# Patient Record
Sex: Female | Born: 1975 | Race: White | Hispanic: No | Marital: Married | Smoking: Current every day smoker
Health system: Southern US, Community
[De-identification: ages and names within clinical notes are randomized; demographics above are authoritative.]

## PROBLEM LIST (undated history)

## (undated) DIAGNOSIS — J189 Pneumonia, unspecified organism: Secondary | ICD-10-CM

## (undated) DIAGNOSIS — G43909 Migraine, unspecified, not intractable, without status migrainosus: Secondary | ICD-10-CM

## (undated) DIAGNOSIS — M1990 Unspecified osteoarthritis, unspecified site: Secondary | ICD-10-CM

## (undated) DIAGNOSIS — F319 Bipolar disorder, unspecified: Secondary | ICD-10-CM

## (undated) DIAGNOSIS — R51 Headache: Secondary | ICD-10-CM

## (undated) DIAGNOSIS — G8190 Hemiplegia, unspecified affecting unspecified side: Secondary | ICD-10-CM

## (undated) DIAGNOSIS — J449 Chronic obstructive pulmonary disease, unspecified: Secondary | ICD-10-CM

## (undated) DIAGNOSIS — R519 Headache, unspecified: Secondary | ICD-10-CM

## (undated) DIAGNOSIS — I639 Cerebral infarction, unspecified: Secondary | ICD-10-CM

## (undated) DIAGNOSIS — T148XXA Other injury of unspecified body region, initial encounter: Secondary | ICD-10-CM

## (undated) DIAGNOSIS — G911 Obstructive hydrocephalus: Secondary | ICD-10-CM

## (undated) DIAGNOSIS — I10 Essential (primary) hypertension: Secondary | ICD-10-CM

## (undated) DIAGNOSIS — R7303 Prediabetes: Secondary | ICD-10-CM

## (undated) DIAGNOSIS — R569 Unspecified convulsions: Secondary | ICD-10-CM

## (undated) HISTORY — PX: BRAIN SURGERY: SHX531

## (undated) HISTORY — PX: CHOLECYSTECTOMY: SHX55

## (undated) HISTORY — PX: ABDOMINAL HYSTERECTOMY: SHX81

## (undated) HISTORY — PX: KNEE SURGERY: SHX244

## (undated) HISTORY — PX: VENTRICULOPERITONEAL SHUNT: SHX204

---

## 1999-04-10 ENCOUNTER — Inpatient Hospital Stay (HOSPITAL_COMMUNITY): Admission: AD | Admit: 1999-04-10 | Discharge: 1999-04-14 | Payer: Self-pay | Admitting: Neurosurgery

## 1999-04-11 ENCOUNTER — Encounter: Payer: Self-pay | Admitting: Neurosurgery

## 1999-04-13 ENCOUNTER — Encounter: Payer: Self-pay | Admitting: Neurosurgery

## 1999-07-21 ENCOUNTER — Emergency Department (HOSPITAL_COMMUNITY): Admission: EM | Admit: 1999-07-21 | Discharge: 1999-07-21 | Payer: Self-pay | Admitting: Emergency Medicine

## 1999-12-13 ENCOUNTER — Emergency Department (HOSPITAL_COMMUNITY): Admission: EM | Admit: 1999-12-13 | Discharge: 1999-12-13 | Payer: Self-pay | Admitting: Emergency Medicine

## 1999-12-14 ENCOUNTER — Encounter: Payer: Self-pay | Admitting: Emergency Medicine

## 2000-03-25 ENCOUNTER — Emergency Department (HOSPITAL_COMMUNITY): Admission: EM | Admit: 2000-03-25 | Discharge: 2000-03-25 | Payer: Self-pay | Admitting: *Deleted

## 2000-08-02 ENCOUNTER — Emergency Department (HOSPITAL_COMMUNITY): Admission: EM | Admit: 2000-08-02 | Discharge: 2000-08-02 | Payer: Self-pay | Admitting: *Deleted

## 2001-07-05 ENCOUNTER — Emergency Department (HOSPITAL_COMMUNITY): Admission: EM | Admit: 2001-07-05 | Discharge: 2001-07-05 | Payer: Self-pay | Admitting: Internal Medicine

## 2001-10-07 ENCOUNTER — Encounter: Payer: Self-pay | Admitting: Obstetrics and Gynecology

## 2001-10-07 ENCOUNTER — Ambulatory Visit (HOSPITAL_COMMUNITY): Admission: RE | Admit: 2001-10-07 | Discharge: 2001-10-07 | Payer: Self-pay | Admitting: Obstetrics and Gynecology

## 2001-11-16 ENCOUNTER — Observation Stay (HOSPITAL_COMMUNITY): Admission: RE | Admit: 2001-11-16 | Discharge: 2001-11-17 | Payer: Self-pay | Admitting: Obstetrics and Gynecology

## 2002-01-11 ENCOUNTER — Emergency Department (HOSPITAL_COMMUNITY): Admission: EM | Admit: 2002-01-11 | Discharge: 2002-01-11 | Payer: Self-pay | Admitting: Emergency Medicine

## 2002-02-12 ENCOUNTER — Ambulatory Visit (HOSPITAL_COMMUNITY): Admission: RE | Admit: 2002-02-12 | Discharge: 2002-02-12 | Payer: Self-pay | Admitting: Internal Medicine

## 2002-03-05 ENCOUNTER — Ambulatory Visit (HOSPITAL_COMMUNITY): Admission: RE | Admit: 2002-03-05 | Discharge: 2002-03-05 | Payer: Self-pay | Admitting: Family Medicine

## 2002-03-05 ENCOUNTER — Encounter: Payer: Self-pay | Admitting: Family Medicine

## 2003-06-07 ENCOUNTER — Emergency Department (HOSPITAL_COMMUNITY): Admission: EM | Admit: 2003-06-07 | Discharge: 2003-06-07 | Payer: Self-pay | Admitting: Emergency Medicine

## 2003-06-07 ENCOUNTER — Encounter: Payer: Self-pay | Admitting: Emergency Medicine

## 2003-10-04 ENCOUNTER — Ambulatory Visit (HOSPITAL_COMMUNITY): Admission: RE | Admit: 2003-10-04 | Discharge: 2003-10-04 | Payer: Self-pay | Admitting: Orthopaedic Surgery

## 2003-10-04 ENCOUNTER — Encounter: Payer: Self-pay | Admitting: Orthopaedic Surgery

## 2005-02-22 ENCOUNTER — Encounter: Admission: RE | Admit: 2005-02-22 | Discharge: 2005-02-22 | Payer: Self-pay

## 2005-09-16 ENCOUNTER — Encounter: Admission: RE | Admit: 2005-09-16 | Discharge: 2005-09-23 | Payer: Self-pay | Admitting: *Deleted

## 2006-04-30 ENCOUNTER — Encounter: Payer: Self-pay | Admitting: Obstetrics and Gynecology

## 2006-10-13 ENCOUNTER — Encounter
Admission: RE | Admit: 2006-10-13 | Discharge: 2006-12-12 | Payer: Self-pay | Admitting: Physical Medicine and Rehabilitation

## 2007-09-01 ENCOUNTER — Emergency Department (HOSPITAL_COMMUNITY): Admission: EM | Admit: 2007-09-01 | Discharge: 2007-09-01 | Payer: Self-pay | Admitting: Emergency Medicine

## 2009-03-15 ENCOUNTER — Encounter
Admission: RE | Admit: 2009-03-15 | Discharge: 2009-05-11 | Payer: Self-pay | Admitting: Physical Medicine and Rehabilitation

## 2010-07-02 ENCOUNTER — Observation Stay (HOSPITAL_COMMUNITY): Admission: EM | Admit: 2010-07-02 | Discharge: 2010-07-04 | Payer: Self-pay | Admitting: Emergency Medicine

## 2011-01-20 ENCOUNTER — Encounter: Payer: Self-pay | Admitting: Internal Medicine

## 2011-02-13 ENCOUNTER — Ambulatory Visit: Payer: Medicare Other | Attending: Physician Assistant | Admitting: Physical Therapy

## 2011-02-13 DIAGNOSIS — M542 Cervicalgia: Secondary | ICD-10-CM | POA: Insufficient documentation

## 2011-02-13 DIAGNOSIS — R5381 Other malaise: Secondary | ICD-10-CM | POA: Insufficient documentation

## 2011-02-13 DIAGNOSIS — IMO0001 Reserved for inherently not codable concepts without codable children: Secondary | ICD-10-CM | POA: Insufficient documentation

## 2011-02-13 DIAGNOSIS — R293 Abnormal posture: Secondary | ICD-10-CM | POA: Insufficient documentation

## 2011-03-17 LAB — POCT CARDIAC MARKERS
CKMB, poc: <1 ng/mL — ABNORMAL LOW (ref 1.0–8.0)
Myoglobin, poc: 44 ng/mL (ref 12–200)
Troponin i, poc: <0.05 ng/mL (ref 0.00–0.09)

## 2011-03-17 LAB — DIFFERENTIAL
Eosinophils Absolute: 0.1 10*3/uL (ref 0.0–0.7)
Eosinophils Absolute: 0.1 10*3/uL (ref 0.0–0.7)
Eosinophils Relative: 2 % (ref 0–5)
Lymphocytes Relative: 28 % (ref 12–46)
Lymphocytes Relative: 44 % (ref 12–46)
Monocytes Absolute: 0.3 10*3/uL (ref 0.1–1.0)
Monocytes Relative: 4 % (ref 3–12)
Monocytes Relative: 5 % (ref 3–12)
Neutro Abs: 3 10*3/uL (ref 1.7–7.7)
Neutrophils Relative %: 49 % (ref 43–77)
Neutrophils Relative %: 67 % (ref 43–77)

## 2011-03-17 LAB — URINALYSIS, ROUTINE W REFLEX MICROSCOPIC
Glucose, UA: NEGATIVE mg/dL
Ketones, ur: NEGATIVE mg/dL
Protein, ur: NEGATIVE mg/dL
Specific Gravity, Urine: 1.01 (ref 1.005–1.030)
Urobilinogen, UA: 0.2 mg/dL (ref 0.0–1.0)

## 2011-03-17 LAB — CBC
HCT: 35.4 % — ABNORMAL LOW (ref 36.0–46.0)
Hemoglobin: 12.3 g/dL (ref 12.0–15.0)
MCH: 32 pg (ref 26.0–34.0)
MCV: 92.1 fL (ref 78.0–100.0)
RBC: 3.29 MIL/uL — ABNORMAL LOW (ref 3.87–5.11)
RDW: 13.7 % (ref 11.5–15.5)
RDW: 13.9 % (ref 11.5–15.5)
WBC: 6.2 10*3/uL (ref 4.0–10.5)
WBC: 8.1 10*3/uL (ref 4.0–10.5)

## 2011-03-17 LAB — COMPREHENSIVE METABOLIC PANEL
BUN: 9 mg/dL (ref 6–23)
GFR calc Af Amer: >60 mL/min (ref >60)
GFR calc non Af Amer: >60 mL/min (ref >60)
Sodium: 141 mEq/L (ref 135–145)
Total Protein: 5 g/dL — ABNORMAL LOW (ref 6.0–8.3)

## 2011-03-17 LAB — BASIC METABOLIC PANEL
CO2: 22 mEq/L (ref 19–32)
GFR calc Af Amer: >60 mL/min (ref >60)
GFR calc non Af Amer: >60 mL/min (ref >60)
Glucose, Bld: 112 mg/dL — ABNORMAL HIGH (ref 70–99)

## 2011-03-17 LAB — VITAMIN B12: Vitamin B-12: 365 pg/mL (ref 211–911)

## 2011-03-25 ENCOUNTER — Ambulatory Visit: Payer: Medicare Other | Admitting: Physical Therapy

## 2011-03-27 ENCOUNTER — Ambulatory Visit: Payer: Medicare Other | Admitting: Physical Therapy

## 2011-04-01 ENCOUNTER — Encounter: Payer: Medicare Other | Admitting: Physical Therapy

## 2011-05-17 NOTE — H&P (Signed)
[facility]  Patient:    Kelli Miranda, Kelli Miranda Visit Number: [id number] MRN: [id number]          Service Type: OBV Location: 4A A427 01 Attending Physician:  Tilda Burrow Dictated by:   Christin Bach, M.D. Admit Date:  11/16/2001 Discharge Date: 11/17/2001                           History and Physical  ADMITTING DIAGNOSES: 1. Chronic pelvic pain. 2. Recurrent hemorrhagic cyst, left ovary. 3. Status post ventriculoperitoneal shunt for communicating hydrocephalus,    April 2000.  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  In January 1999, the patient underwent laparoscopic evaluation which identified right lower quadrant adhesions from the ileum to the lower abdomen wall.  There was a normal-appearing ovary on the right that was bound down to the right sidewall.  The patient had a relatively normal-appearing left ovary at the time of that surgery, and the decision was made to remove the right tube and ovary and leave the left ovary.  Unfortunately, the patient developed postoperative hematoma attributable to excess activity within two hours of her surgery which induced severe vomiting (the patient went down to smoke less than one hour postsurgery) and then subsequently developed a hematoma which required laparoscopic evacuation on January 14, 1998, two days postoperatively.  The patient then went on and did well surgically, but developed progressive left lower quadrant pain beginning in 2001 which has been evaluated by ultrasound showing intermittent cysts on the left ovary which seems to be related to the pain.  Most recent ultrasound October 2002, showed a 3.5 x 3.6 x 2.8 cm hemorrhage within the left ovary.   The patient remained persistently tender in the left adnexa with a sense of fullness bilaterally, but primarily pain on the left.  The patient is being admitted at this time for laparoscopic removal of left tube and ovary.  She is aware that it may be necessary to perform an open procedure given that she might have additional adhesions internally.  The patient has had a bowel prep overnight to reduce abdominal contents for surgery.  PAST MEDICAL HISTORY: 1. Also significant for severe headache and neurologic abnormalities noted in    early 2000 resulting in the diagnosis of communicating hydrocephalus,    resulting in ventriculoperitoneal shunt placed April 2000. 2. Additionally, the patient has asthma and has anxiety disorder.  PAST SURGICAL HISTORY: 1. Cholecystectomy October 1997. 2. Vaginal hysterectomy June 1998. 3. Laparoscopic right salpingo-oophorectomy in January 1999. 4. Evacuation of pelvic hematoma in January 1999. 6. Ventriculoperitoneal shunt for communicating hydrocephalus April 2000.  MEDICATIONS: 1. Xanax 0.5 mg p.o. q.d. 2. Ambien 10 mg q.h.s. as needed for sleep. 3. Depakote. 4. Imitrex. 5. Elavil. 6. Voltaren taken for pain.  PHYSICAL EXAMINATION:  GENERAL:  Reveals a somber Caucasian female with a somewhat slow speech pattern since status post her hydrocephalus symptoms and subsequent surgery.  NEUROLOGIC:  The patient has cranial nerves 2-12 intact.  Pupils are equal, round and reactive.  NECK:  Trachea is midline.  CHEST:  Clear to auscultation.  ABDOMEN:  Nontender with well-healed surgical sites in the past from past laparoscopic procedure.  GENITOURINARY:  External genitalia normal female.  Vaginal exam normal, uterus and cervix absent with 2+ tender adnexa, slight fullness noted.  RECTAL:  No current bleeding per rectum; the patient has had hemorrhoids in the past and these are currently resolved.  ASSESSMENT:  Chronic pelvic pain.  I  suspect adhesions and hemorrhagic left ovarian cyst.  PLAN:  Laparoscopic left salpingo-oophorectomy and lysis of adhesions, possible open procedure on November 16, 2001. Dictated by:   Christin Bach, M.D. Attending Physician:  Tilda Burrow DD:  11/15/01 TD:  11/16/01 Job: [id number]

## 2011-05-17 NOTE — Op Note (Signed)
[facility]  Patient:    Kelli Miranda, Kelli Miranda Visit Number: [id number] MRN: [id number]          Service Type: DSU Location: DAY Attending Physician:  Tilda Burrow Dictated by:   Christin Bach, M.D. Proc. Date: 11/16/01 Admit Date:  11/16/2001                             Operative Report  PREOPERATIVE DIAGNOSES:  Left lower quadrant pain; ovarian enlargement, suspected ovarian cyst.  POSTOPERATIVE DIAGNOSES:  Left lower quadrant pain; ovarian enlargement, suspected ovarian cyst -- functional left ovarian cyst; extensive adhesions from sigmoid to left adnexa and anterior abdominal wall.  PROCEDURE:  SURGEON:  Christin Bach, M.D.  ASSISTANT:  Estelle June, C.N.M.  ANESTHESIA:  General, ______ , C.R.N.A.  COMPLICATIONS:  None.  FINDINGS:  Adhesions from epiploic fat to anterior abdominal wall to the left pelvic sidewall overlying left tube and ovary.  Scanning the abdomen also revealed that there were thin filmy adhesions around the tip of the ventriculoperitoneal shunt which was in the right upper quadrant, extensive adhesions from the ascending colon to the right pelvic sidewall, asymptomatic and left alone.  DETAILS OF PROCEDURE:  Patient was taken to the operating room, prepped and draped for combined abdominal and vaginal procedure, with a Foley catheter in place, and the vaginal packing placed on ring forceps to allow orientation to the vaginal apex.  Infraumbilical vertical skin incision was made as well as a suprapubic transverse incision, with an 11-mm disposable port placed through the umbilicus after pneumoperitoneum achieved with a Veress needle, which had its position confirmed with the water test.  The abdomen was devoid of adhesions over the anterior abdominal wall but did have some previously mentioned sigmoid colon adhesions to the anterior aspects of the pelvis and, of course, the left adnexa was obscured behind  sigmoid adhesions.  A couple of the sigmoid adhesions were quite tense and appeared to have the potential to be sources of discomfort.  These were all dissected free and the sigmoid mobilized away from the overlying left adnexa.  The left tube and ovary were then put on traction, the round ligament identified, taken down, the retroperitoneum entered on the side and the tube and ovary placed on traction so that the infundibulopelvic ligament could be isolated and cross-clamped without any risk to the ureter or pelvic sidewall structures.  Endo GIA was placed through a left lower quadrant 12-mm port, placed across the pedicle and fired with good hemostasis.  Ovary was removed through the left lower quadrant with an EndoCatch.  The pelvis was irrigated, suctioned out and confirmed as hemostatic, with point cautery used at a couple of spots where continued slight oozing persisted.  Scanning the upper abdomen revealed the previously mentioned thin filmy adhesions from the liver.  There were some very dense adhesions from the left side of the liver to the round ligament.  These were visualized and photo-documented and left alone.  Patient tolerated procedure well, had deflation of the abdomen, removal of CO2 without any instilling of saline into the abdomen, and the fascial openings were closed at the umbilicus and the left lower quadrant.  Subcuticular 4-0 Dexon closure of all three incisions was performed.  Patient tolerated procedure well and went to recovery room in good condition. Dictated by:   Christin Bach, M.D. Attending Physician:  Tilda Burrow DD:  11/16/01 TD:  11/16/01 Job: [id number]

## 2011-05-17 NOTE — Op Note (Signed)
[facility]  Patient:    Kelli Miranda, Kelli Miranda Visit Number: [id number] MRN: [id number]          Service Type: END Location: DAY Attending Physician:  Jonathon Bellows Dictated by:   Roetta Sessions, M.D. Proc. Date: 02/12/02 Admit Date:  02/12/2002 Discharge Date: 02/12/2002   CC:         Rockne Coons., M.D.   Operative Report  PROCEDURE:  Diagnostic esophagogastroduodenoscopy followed by colonoscopy, ileoscopy.  INDICATIONS FOR PROCEDURE:  The patient is a 35 year old lady with abdominal pain, intermittent nausea and vomiting, hematemesis, and hematochezia who is undergoing colonoscopy and EGD.  This approach has been discussed with Ms. Kelli Miranda at length previously in the office and again today at the bedside. Potential risks, benefits, and alternatives have been reviewed.  She was slated to have these examinations last week but because of the terminal illness of her grandmother, she put off the studies and also has not really started taking the NuLev or the Nexium that was prescribed in our office.  She has given written consent for me to carry these procedures out today.  They have been discussed with her at length.  I believe she is at low risk for conscious sedation with Versed and Demerol.  DESCRIPTION OF PROCEDURE:  Oxygen saturation, blood pressure, pulse and respirations were monitored throughout the entirety of both procedures.  CONSCIOUS SEDATION:  Versed 6 mg IV, Demerol 100 mg IV in divided doses, ampicillin 2 g IV and gentamicin 80 mg IV for prophylaxis given her VP shunt.  INSTRUMENT:  Olympus videochip gastroscope and colonoscope.  EGD FINDINGS:  Examination of the tubular esophagus revealed no mucosal abnormalities.  The EG junction was easily traversed.  STOMACH:  Gastric cavity was empty and insufflated well with air.  A thorough examination of the gastric mucosa including retroflexed view of the proximal stomach and  esophagogastric junction demonstrated no abnormalities.   Pylorus was patent and easily traversed.  DUODENUM:  The bulb and second portion appeared normal.  THERAPEUTIC/DIAGNOSTIC MANEUVERS PERFORMED:  None.  The patient tolerated the procedure very well and was prepared for colonoscopy.  COLONOSCOPIC FINDINGS:  Digital rectal examination revealed no abnormalities.  ENDOSCOPIC FINDINGS:  Prep was adequate.  RECTUM:  Examination of the rectal mucosa including retroflexed view  of the anal verge including _____ and anal canal revealed minimal anal canal hemorrhoids.  Rectum appeared normal.  COLON:  The colonic mucosa was surveyed from the rectosigmoid junction through the left, transverse and right colon to the area of the appendiceal orifice, ileocecal valve and cecum.  These structures were well seen and photographed. The colonic mucosa all the way to the cecum appeared normal.  The terminal ileum was intubated to 15 cm.  This segment of the GI tract also appeared normal.  From the level, the scope was slowly withdrawn and all previously mentioned mucosal surfaces were again seen.  Again, no other abnormalities were observed.  The patient tolerated the procedures well and was reacted in endoscopy.  IMPRESSION: 1. Esophagogastroduodenoscopy normal. 2. Colonoscopic findings:  Minimal anal canal hemorrhoids, otherwise normal    rectum, normal colon, normal terminal ileum.  RECOMMENDATIONS:  I told Ms. Kelli Miranda she should start taking Nexium 40 mg early daily, at least 30 minutes before breakfast and start taking the NuLev one 30 minutes orally before meals.  There is no endoscopic explanation for any of the patients symptoms on todays studies.  We will see how she responds to empiric  treatment.  I have asked Ms. Kelli Miranda to return to see [place] in three to four weeks so we can assess her progress and go from there.  RECOMMENDATIONS:    IMPRESSION: 3. The remainder of the upper  gastrointestinal tract appeared normal.  RECOMMENDATIONS: Dictated by:   Roetta Sessions, M.D. Attending Physician:  Jonathon Bellows DD:  02/12/02 TD:  02/12/02 Job: [id number]

## 2011-05-17 NOTE — Op Note (Signed)
[facility]  Patient:    Kelli Miranda, Kelli Miranda Visit Number: [id number] MRN: [id number]          Service Type: END Location: DAY Attending Physician:  Jonathon Bellows Dictated by:   Roetta Sessions, M.D. Proc. Date: 02/12/02 Admit Date:  02/12/2002 Discharge Date: 02/12/2002   CC:         Rockne Coons., M.D.   Operative Report  ADDENDUM:  RECOMMENDATIONS: No endoscopic explanation for the patients symptoms based on the examination today although she could have relatively trivial bleeding from anal canal hemorrhoids.  She is to use her Nexium and NuLev as previously prescribed.  I will also give her a course of Anusol HC suppositories one per rectum at bedtime.  Will have her come back to see [place] in three to four weeks so we can assess her progress. Dictated by:   Roetta Sessions, M.D. Attending Physician:  Jonathon Bellows DD:  02/12/02 TD:  02/12/02 Job: [id number]

## 2012-01-07 ENCOUNTER — Ambulatory Visit: Payer: Medicare Other | Attending: Orthopaedic Surgery | Admitting: Physical Therapy

## 2012-01-07 DIAGNOSIS — M25569 Pain in unspecified knee: Secondary | ICD-10-CM | POA: Insufficient documentation

## 2012-01-07 DIAGNOSIS — R5381 Other malaise: Secondary | ICD-10-CM | POA: Insufficient documentation

## 2012-01-07 DIAGNOSIS — IMO0001 Reserved for inherently not codable concepts without codable children: Secondary | ICD-10-CM | POA: Insufficient documentation

## 2012-01-09 ENCOUNTER — Ambulatory Visit: Payer: Medicare Other | Admitting: Physical Therapy

## 2012-01-16 ENCOUNTER — Encounter: Payer: Medicare Other | Admitting: Physical Therapy

## 2012-01-17 ENCOUNTER — Encounter: Payer: Medicare Other | Admitting: *Deleted

## 2012-01-20 ENCOUNTER — Encounter: Payer: Medicare Other | Admitting: Physical Therapy

## 2012-01-28 ENCOUNTER — Encounter: Payer: Medicare Other | Admitting: Physical Therapy

## 2012-01-29 ENCOUNTER — Encounter: Payer: Medicare Other | Admitting: Physical Therapy

## 2012-01-30 ENCOUNTER — Encounter: Payer: Medicare Other | Admitting: Physical Therapy

## 2012-02-27 ENCOUNTER — Ambulatory Visit: Payer: Medicare Other | Admitting: Physical Therapy

## 2015-10-25 ENCOUNTER — Inpatient Hospital Stay (HOSPITAL_COMMUNITY): Payer: Medicare Other

## 2015-10-25 ENCOUNTER — Encounter (HOSPITAL_COMMUNITY): Payer: Self-pay | Admitting: Neurology

## 2015-10-25 ENCOUNTER — Inpatient Hospital Stay (HOSPITAL_COMMUNITY)
Admission: EM | Admit: 2015-10-25 | Discharge: 2015-10-27 | DRG: 056 | Disposition: A | Discharge status: Home Health | Payer: Medicare Other | Attending: Internal Medicine | Admitting: Internal Medicine

## 2015-10-25 ENCOUNTER — Emergency Department (HOSPITAL_COMMUNITY): Payer: Medicare Other

## 2015-10-25 DIAGNOSIS — I10 Essential (primary) hypertension: Secondary | ICD-10-CM | POA: Diagnosis present

## 2015-10-25 DIAGNOSIS — I6203 Nontraumatic chronic subdural hemorrhage: Secondary | ICD-10-CM | POA: Diagnosis present

## 2015-10-25 DIAGNOSIS — R739 Hyperglycemia, unspecified: Secondary | ICD-10-CM | POA: Diagnosis not present

## 2015-10-25 DIAGNOSIS — L03211 Cellulitis of face: Secondary | ICD-10-CM | POA: Diagnosis present

## 2015-10-25 DIAGNOSIS — R208 Other disturbances of skin sensation: Secondary | ICD-10-CM | POA: Diagnosis not present

## 2015-10-25 DIAGNOSIS — R209 Unspecified disturbances of skin sensation: Secondary | ICD-10-CM

## 2015-10-25 DIAGNOSIS — I69354 Hemiplegia and hemiparesis following cerebral infarction affecting left non-dominant side: Secondary | ICD-10-CM | POA: Diagnosis not present

## 2015-10-25 DIAGNOSIS — E1165 Type 2 diabetes mellitus with hyperglycemia: Secondary | ICD-10-CM | POA: Diagnosis present

## 2015-10-25 DIAGNOSIS — Z982 Presence of cerebrospinal fluid drainage device: Secondary | ICD-10-CM | POA: Diagnosis not present

## 2015-10-25 DIAGNOSIS — R531 Weakness: Secondary | ICD-10-CM | POA: Diagnosis present

## 2015-10-25 DIAGNOSIS — K111 Hypertrophy of salivary gland: Secondary | ICD-10-CM | POA: Diagnosis present

## 2015-10-25 DIAGNOSIS — R29898 Other symptoms and signs involving the musculoskeletal system: Secondary | ICD-10-CM | POA: Diagnosis present

## 2015-10-25 DIAGNOSIS — K1120 Sialoadenitis, unspecified: Secondary | ICD-10-CM | POA: Diagnosis present

## 2015-10-25 DIAGNOSIS — F17200 Nicotine dependence, unspecified, uncomplicated: Secondary | ICD-10-CM | POA: Diagnosis present

## 2015-10-25 DIAGNOSIS — E876 Hypokalemia: Secondary | ICD-10-CM | POA: Diagnosis present

## 2015-10-25 DIAGNOSIS — R200 Anesthesia of skin: Secondary | ICD-10-CM

## 2015-10-25 DIAGNOSIS — J449 Chronic obstructive pulmonary disease, unspecified: Secondary | ICD-10-CM | POA: Diagnosis present

## 2015-10-25 DIAGNOSIS — I6789 Other cerebrovascular disease: Secondary | ICD-10-CM | POA: Diagnosis not present

## 2015-10-25 DIAGNOSIS — M6289 Other specified disorders of muscle: Secondary | ICD-10-CM | POA: Diagnosis not present

## 2015-10-25 DIAGNOSIS — I639 Cerebral infarction, unspecified: Secondary | ICD-10-CM | POA: Diagnosis not present

## 2015-10-25 DIAGNOSIS — F419 Anxiety disorder, unspecified: Secondary | ICD-10-CM | POA: Diagnosis present

## 2015-10-25 DIAGNOSIS — F329 Major depressive disorder, single episode, unspecified: Secondary | ICD-10-CM | POA: Diagnosis present

## 2015-10-25 HISTORY — DX: Essential (primary) hypertension: I10

## 2015-10-25 HISTORY — DX: Chronic obstructive pulmonary disease, unspecified: J44.9

## 2015-10-25 HISTORY — DX: Cerebral infarction, unspecified: I63.9

## 2015-10-25 LAB — CBC WITH DIFFERENTIAL/PLATELET
BASOS ABS: 0 10*3/uL (ref 0.0–0.1)
BASOS PCT: 0 %
Eosinophils Absolute: 0.1 10*3/uL (ref 0.0–0.7)
Eosinophils Relative: 1 %
HEMATOCRIT: 37.5 % (ref 36.0–46.0)
HEMOGLOBIN: 12.3 g/dL (ref 12.0–15.0)
LYMPHS PCT: 14 %
Lymphs Abs: 1.4 10*3/uL (ref 0.7–4.0)
MCH: 30.8 pg (ref 26.0–34.0)
MCHC: 32.8 g/dL (ref 30.0–36.0)
MCV: 93.8 fL (ref 78.0–100.0)
Monocytes Absolute: 0.6 10*3/uL (ref 0.1–1.0)
Monocytes Relative: 6 %
NEUTROS ABS: 7.8 10*3/uL — AB (ref 1.7–7.7)
NEUTROS PCT: 79 %
Platelets: 172 10*3/uL (ref 150–400)
RBC: 4 MIL/uL (ref 3.87–5.11)
RDW: 14.3 % (ref 11.5–15.5)
WBC: 9.9 10*3/uL (ref 4.0–10.5)

## 2015-10-25 LAB — BASIC METABOLIC PANEL
ANION GAP: 9 (ref 5–15)
BUN: 6 mg/dL (ref 6–20)
CALCIUM: 8.6 mg/dL — AB (ref 8.9–10.3)
CHLORIDE: 103 mmol/L (ref 101–111)
CO2: 23 mmol/L (ref 22–32)
Creatinine, Ser: 0.78 mg/dL (ref 0.44–1.00)
GFR calc non Af Amer: >60 mL/min (ref >60)
Glucose, Bld: 104 mg/dL — ABNORMAL HIGH (ref 65–99)
POTASSIUM: 3.3 mmol/L — AB (ref 3.5–5.1)
Sodium: 135 mmol/L (ref 135–145)

## 2015-10-25 MED ORDER — LORAZEPAM 2 MG/ML IJ SOLN
1.0000 mg | Freq: Once | INTRAMUSCULAR | Status: AC
  Administered 2015-10-25: 1 mg via INTRAVENOUS
  Filled 2015-10-25: qty 1

## 2015-10-25 MED ORDER — IOHEXOL 300 MG/ML  SOLN
75.0000 mL | Freq: Once | INTRAMUSCULAR | Status: AC | PRN
  Administered 2015-10-25: 75 mL via INTRAVENOUS

## 2015-10-25 NOTE — ED Notes (Signed)
Resident at bedside.  

## 2015-10-25 NOTE — H&P (Signed)
[facility]                                                                                    Patient Demographics  Kelli Miranda, is a 39 y.o. female  CSN: [id number]  MRN: [id number]  DOB - 1976/05/17  Admit Date - 10/25/2015  Outpatient Primary MD for the patient is PROVIDER NOT IN SYSTEM   With History of -  Past Medical History  Diagnosis Date  . Stroke (HCC)   . Diabetes mellitus without complication (HCC)   . Hypertension   . COPD (chronic obstructive pulmonary disease) [facility])       Past Surgical History  Procedure Laterality Date  . Knee surgery    . Ventriculoperitoneal shunt      in for   Chief Complaint  Patient presents with  . Facial Swelling     HPI  Kelli Miranda  is a 39 y.o. female, with past medical history significant for ventriculoperitoneal shunt and recent CVA around 4 months ago, sent from her PMDs office for evaluation of right facial swelling. The patient also reports new onset of right side weakness especially in the hand. Denies any history of dizziness or loss of consciousness. CT of the head in the emergency room was negative and MRI is pending. CT of the face showed mild right facial pre auricular soft tissue swelling most compatible with cellulitis without drainable fluid collection with mild inflammation of the parotid gland which is likely reactive.    Review of Systems    In addition to the HPI above,  No Fever-chills, No Headache, No changes with Vision or hearing, No problems swallowing food or Liquids, No Chest pain, Cough or Shortness of Breath, No Abdominal pain, No Nausea or Vommitting, Bowel movements are regular, No Blood in stool or Urine, No dysuria, No new skin rashes or bruises, No new joints pains-aches,  Right-sided hand weakness new weakness, pain in the feet which looks nonspecific, No recent weight gain or loss, No polyuria, polydypsia or polyphagia, No significant Mental  Stressors.  A full 10 point Review of Systems was done, except as stated above, all other Review of Systems were negative.   Social History Social History  Substance Use Topics  . Smoking status: Current Every Day Smoker  . Smokeless tobacco: Not on file  . Alcohol Use: No     Family History No family history on file.   Prior to Admission medications   Medication Sig Start Date End Date Taking? Authorizing Provider  ALPRAZolam Prudy Feeler) 0.5 MG tablet Take 0.5 mg by mouth at bedtime as needed for anxiety.   Yes Historical Provider, MD  diclofenac sodium (VOLTAREN) 1 % GEL Apply 2 g topically daily as needed. For knee pain   Yes Historical Provider, MD  FLUoxetine (PROZAC) 20 MG tablet Take 60 mg by mouth daily.   Yes Historical Provider, MD  Fluticasone-Salmeterol (ADVAIR) 250-50 MCG/DOSE AEPB Inhale 1 puff into the lungs 2 (two) times daily as needed. For shortness of breath   Yes Historical Provider, MD  gabapentin (NEURONTIN) 300 MG capsule Take 300 mg by mouth 3 (three) times daily.   Yes Historical  Provider, MD  HYDROcodone-acetaminophen (NORCO) 10-325 MG tablet Take 1 tablet by mouth every 6 (six) hours as needed for moderate pain.   Yes Historical Provider, MD  levETIRAcetam (KEPPRA) 250 MG tablet Take 250 mg by mouth 2 (two) times daily.   Yes Historical Provider, MD  ondansetron (ZOFRAN-ODT) 4 MG disintegrating tablet Take 4 mg by mouth 2 (two) times daily. Take every day per patient and family members   Yes Historical Provider, MD  pregabalin (LYRICA) 50 MG capsule Take 150 mg by mouth 3 (three) times daily.   Yes Historical Provider, MD  promethazine (PHENERGAN) 25 MG tablet Take 25 mg by mouth every 6 (six) hours as needed for nausea or vomiting.   Yes Historical Provider, MD  QUEtiapine (SEROQUEL) 100 MG tablet Take 100 mg by mouth at bedtime.   Yes Historical Provider, MD  topiramate (TOPAMAX) 200 MG tablet Take 400 mg by mouth 2 (two) times daily.   Yes Historical Provider,  MD  traZODone (DESYREL) 100 MG tablet Take 300 mg by mouth at bedtime.   Yes Historical Provider, MD    Allergies  Allergen Reactions  . Imitrex [Sumatriptan]     Hives   . Latex     Hives   . Percocet [Oxycodone-Acetaminophen]     Face swelling     Physical Exam  Vitals  Blood pressure 109/78, pulse 88, temperature 98.2 F (36.8 C), temperature source Oral, resp. rate 16, SpO2 93 %.   1. General well-developed, well-nourished female in no significant acute distress  2. Flat affect and insight, Not Suicidal or Homicidal, Awake Alert, Oriented X 3.  3.  ALL C.Nerves Intact, Strength 4 over 5 left arm and lower extremities, right hand 3 over 5 Sensation intact all 4 extremities, Plantars absent.  4. Ears and Eyes appear Normal, Conjunctivae clear, PERRLA. Moist Oral Mucosa.  5. Supple Neck, No JVD, No cervical lymphadenopathy appriciated, No Carotid Bruits.  6. Symmetrical Chest wall movement, Good air movement bilaterally, CTAB.  7. RRR, No Gallops, Rubs or Murmurs, No Parasternal Heave.  8. Positive Bowel Sounds, Abdomen Soft, Non tender, No organomegaly appriciated,No rebound -guarding or rigidity.  9.  No Cyanosis, Normal Skin Turgor, No Skin Rash or Bruise.  10. Good muscle tone,  joints appear normal , no effusions, Normal ROM.  11. No Palpable Lymph Nodes in Neck or Axillae    Data Review  CBC  Recent Labs Lab 10/25/15 2015  WBC 9.9  HGB 12.3  HCT 37.5  PLT 172  MCV 93.8  MCH 30.8  MCHC 32.8  RDW 14.3  LYMPHSABS 1.4  MONOABS 0.6  EOSABS 0.1  BASOSABS 0.0   ------------------------------------------------------------------------------------------------------------------  Chemistries   Recent Labs Lab 10/25/15 2015  NA 135  K 3.3*  CL 103  CO2 23  GLUCOSE 104*  BUN 6  CREATININE 0.78  CALCIUM 8.6*   ------------------------------------------------------------------------------------------------------------------ CrCl cannot be  calculated (Unknown ideal weight.). ------------------------------------------------------------------------------------------------------------------ No results for input(s): TSH, T4TOTAL, T3FREE, THYROIDAB in the last 72 hours.  Invalid input(s): FREET3   Coagulation profile No results for input(s): INR, PROTIME in the last 168 hours. ------------------------------------------------------------------------------------------------------------------- No results for input(s): DDIMER in the last 72 hours. -------------------------------------------------------------------------------------------------------------------  Cardiac Enzymes No results for input(s): CKMB, TROPONINI, MYOGLOBIN in the last 168 hours.  Invalid input(s): CK ------------------------------------------------------------------------------------------------------------------ Invalid input(s): POCBNP   ---------------------------------------------------------------------------------------------------------------  Urinalysis    Component Value Date/Time   COLORURINE YELLOW 07/02/2010 1515   APPEARANCEUR CLEAR 07/02/2010 1515   LABSPEC 1.010 07/02/2010 1515  PHURINE 6.0 07/02/2010 1515   GLUCOSEU NEGATIVE 07/02/2010 1515   HGBUR NEGATIVE 07/02/2010 1515   BILIRUBINUR NEGATIVE 07/02/2010 1515   KETONESUR NEGATIVE 07/02/2010 1515   PROTEINUR NEGATIVE 07/02/2010 1515   UROBILINOGEN 0.2 07/02/2010 1515   NITRITE NEGATIVE 07/02/2010 1515   LEUKOCYTESUR SMALL* 07/02/2010 1515    ----------------------------------------------------------------------------------------------------------------   Imaging results:   Ct Head Wo Contrast  10/25/2015  CLINICAL DATA:  RIGHT-sided facial swelling, stroke-like symptoms with RIGHT upper extremity weakness for 2 days. History of diabetes, hypertension and stroke. EXAM: CT HEAD WITHOUT CONTRAST CT MAXILLOFACIAL WITH CONTRAST TECHNIQUE: Multidetector CT imaging of the head was  performed using the standard protocol without IV contrast. CT imaging of the maxillofacial structures was performed with intravenous contrast. Multiplanar CT image reconstructions were also generated. CONTRAST:  75mL OMNIPAQUE IOHEXOL 300 MG/ML  SOLN COMPARISON:  CT head June 29, 2015 MRI of the brain June 29, 2015 FINDINGS: CT HEAD FINDINGS No intraparenchymal hemorrhage, mass effect, midline shift or acute large vascular territory infarct. 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 Ventriculoperitoneal shunt via a RIGHT parietal burr hole, with distal tip terminating in LEFT frontal horn, unchanged. Mild to moderate ventriculomegaly, decreased from prior imaging, no CT findings of hydrocephalus. No abnormal extra-axial fluid collections. Basal cisterns are patent. No skull fracture. CT MAXILLOFACIAL FINDINGS RIGHT facial/ preauricular mild soft tissue swelling, reticulated fat with minimal inflammatory changes superficial lobe of the RIGHT parotid gland. No parotid masses, ductal dilatation or sialolith. No radiopaque foreign bodies. Ocular globes and orbital contents are normal. Paranasal sinuses and mastoid air cells are well aerated. RIGHT jugular bulb dehiscence. No destructive bony lesions. Included airway is patent. Ventriculoperitoneal shunt courses through RIGHT neck, visualized portion is contiguous. Possible cerebellar DVA use though, incompletely characterized. IMPRESSION: CT HEAD: No acute intracranial process. Slightly decreased ventriculomegaly, stable positioning of ventriculoperitoneal shunt. 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 CT MAXILLOFACIAL: Mild RIGHT facial/preauricular soft tissue swelling most compatible cellulitis without drainable fluid collection. Mild inflammation of the parotid gland is likely reactive, less likely parotiditis. Electronically Signed   By: Awilda Metroourtnay  Bloomer M.D.   On: 10/25/2015 22:35   Ct Maxillofacial W/cm  10/25/2015  CLINICAL DATA:  RIGHT-sided facial swelling,  stroke-like symptoms with RIGHT upper extremity weakness for 2 days. History of diabetes, hypertension and stroke. EXAM: CT HEAD WITHOUT CONTRAST CT MAXILLOFACIAL WITH CONTRAST TECHNIQUE: Multidetector CT imaging of the head was performed using the standard protocol without IV contrast. CT imaging of the maxillofacial structures was performed with intravenous contrast. Multiplanar CT image reconstructions were also generated. CONTRAST:  75mL OMNIPAQUE IOHEXOL 300 MG/ML  SOLN COMPARISON:  CT head June 29, 2015 MRI of the brain June 29, 2015 FINDINGS: CT HEAD FINDINGS No intraparenchymal hemorrhage, mass effect, midline shift or acute large vascular territory infarct. 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 Ventriculoperitoneal shunt via a RIGHT parietal burr hole, with distal tip terminating in LEFT frontal horn, unchanged. Mild to moderate ventriculomegaly, decreased from prior imaging, no CT findings of hydrocephalus. No abnormal extra-axial fluid collections. Basal cisterns are patent. No skull fracture. CT MAXILLOFACIAL FINDINGS RIGHT facial/ preauricular mild soft tissue swelling, reticulated fat with minimal inflammatory changes superficial lobe of the RIGHT parotid gland. No parotid masses, ductal dilatation or sialolith. No radiopaque foreign bodies. Ocular globes and orbital contents are normal. Paranasal sinuses and mastoid air cells are well aerated. RIGHT jugular bulb dehiscence. No destructive bony lesions. Included airway is patent. Ventriculoperitoneal shunt courses through RIGHT neck, visualized portion is contiguous. Possible cerebellar DVA  use though, incompletely characterized. IMPRESSION: CT HEAD: No acute intracranial process. Slightly decreased ventriculomegaly, stable positioning of ventriculoperitoneal shunt. 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 CT MAXILLOFACIAL: Mild RIGHT facial/preauricular soft tissue swelling most compatible cellulitis without drainable fluid collection. Mild  inflammation of the parotid gland is likely reactive, less likely parotiditis. Electronically Signed   By: Awilda Metro M.D.   On: 10/25/2015 22:35   Dg Chest Portable 1 View  10/25/2015  CLINICAL DATA:  Swelling on the right side of the face for 3 days. VP shunt placed in 2000. History of stroke and left-sided weakness. EXAM: PORTABLE CHEST 1 VIEW COMPARISON:  Chest radiograph 07/02/2010 FINDINGS: Prominent interstitial lung markings bilaterally with mild peribronchial thickening. Mild fullness in the perihilar regions. Heart size is within normal limits. The trachea is midline. There is a right VP shunt. IMPRESSION: Prominent interstitial lung markings with perihilar fullness. Findings are concerning for mild pulmonary edema. Electronically Signed   By: Richarda Overlie M.D.   On: 10/25/2015 21:28      Assessment & Plan   1. New right upper extremity weakness with history of CVA and left-sided weakness around 4 months ago     CT of the head negative     MRI of the head pending     Neurology consult pending  2. Right facial cellulitis     IV antibiotics      No drainable abscess  3. Hypertension  4. Diabetes mellitus   DVT Prophylaxis Lovenox  AM Labs Ordered, also please review Full Orders  Code Status full code  Disposition Plan: Home  Time spent in minutes : 42 minutes  Condition GUARDED   @

## 2015-10-25 NOTE — ED Notes (Signed)
Pt comes from home where she has had swelling to right side of her face for 3 days. Pt has VP shunt that was placed in 2000. She had a stroke 4 months ago and now has left sided weakness. Pt is alert, reports now has trouble to right side for 3 days.

## 2015-10-25 NOTE — ED Notes (Signed)
MD at bedside. 

## 2015-10-25 NOTE — ED Notes (Signed)
Pt was told she will be going for MRI and that Internal Med was consulted; Pt states she is not staying here tonight; RN advised pt to discuss with MD

## 2015-10-25 NOTE — ED Provider Notes (Signed)
Arrival Date & Time: 10/25/15 & 1849  History   Chief Complaint  Patient presents with  . Facial Swelling   HPI Kelli Miranda is a 39 y.o. female with a chief complaint of Facial Swelling  Patient presents from primary care office due to concerns for right-sided facial swelling. Patient has no dysphagia or abnormal breathing and is noted to be in no respiratory distress. Patient also endorses no abnormal swelling or abnormal sensation of masses or cuts or trauma inside mouth. Patient has very poor dentition and states she is a multiple pack per day smoker and has prior medical history significant for stroke partially 4 months ago. 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 Patient denies fevers chills or neck pain headache and states she is also no altered mental status. Patient states for the past 2 days she had abnormal weakness of right upper extremity with abnormal sensation. Patient states the symptoms resolved this morning and states "I think I had a stroke 2 nights ago." Patient's daughter and family members aren't room and state the patient is "very bad about taking care of herself and she will tell you that nothing is wrong always." When asked about prior stroke patient minutes to left upper and lower extremity weakness which is residual a new baseline from prior stroke.  Past Medical History  I reviewed & agree with nursing's documentation on PMHx, PSHx, SHx and FHx. Past Medical History  Diagnosis Date  . Stroke (HCC)   . Diabetes mellitus without complication (HCC)   . Hypertension   . COPD (chronic obstructive pulmonary disease) [facility])    Past Surgical History  Procedure Laterality Date  . Knee surgery    . Ventriculoperitoneal shunt     Social History   Social History  . Marital Status: Married    Spouse Name: N/A  . Number of Children: N/A  . Years of Education: N/A   Social  History Main Topics  . Smoking status: Current Every Day Smoker  . Smokeless tobacco: None  . Alcohol Use: No  . Drug Use: None  . Sexual Activity: Not Asked   Other Topics Concern  . None   Social History Narrative  . None   No family history on file.  Review of Systems  Complete ROS obtained and pertinent positive and negatives documented above in HPI. All other ROS negative.  Allergies  Imitrex; Latex; and Percocet  Home Medications   Prior to Admission medications   Medication Sig Start Date End Date Taking? Authorizing Provider  ALPRAZolam Prudy Feeler) 0.5 MG tablet Take 0.5 mg by mouth at bedtime as needed for anxiety.   Yes Historical Provider, MD  diclofenac sodium (VOLTAREN) 1 % GEL Apply 2 g topically daily as needed. For knee pain   Yes Historical Provider, MD  FLUoxetine (PROZAC) 20 MG tablet Take 60 mg by mouth daily.   Yes Historical Provider, MD  Fluticasone-Salmeterol (ADVAIR) 250-50 MCG/DOSE AEPB Inhale 1 puff into the lungs 2 (two) times daily as needed. For shortness of breath   Yes Historical Provider, MD  gabapentin (NEURONTIN) 300 MG capsule Take 300 mg by mouth 3 (three) times daily.   Yes Historical Provider, MD  HYDROcodone-acetaminophen (NORCO) 10-325 MG tablet Take 1 tablet by mouth every 6 (six) hours as needed for moderate pain.   Yes Historical Provider, MD  levETIRAcetam (KEPPRA) 250 MG tablet Take 250 mg by mouth 2 (two)  times daily.   Yes Historical Provider, MD  ondansetron (ZOFRAN-ODT) 4 MG disintegrating tablet Take 4 mg by mouth 2 (two) times daily. Take every day per patient and family members   Yes Historical Provider, MD  pregabalin (LYRICA) 50 MG capsule Take 150 mg by mouth 3 (three) times daily.   Yes Historical Provider, MD  promethazine (PHENERGAN) 25 MG tablet Take 25 mg by mouth every 6 (six) hours as needed for nausea or vomiting.   Yes Historical Provider, MD  QUEtiapine (SEROQUEL) 100 MG tablet Take 100 mg by mouth at bedtime.   Yes  Historical Provider, MD  topiramate (TOPAMAX) 200 MG tablet Take 400 mg by mouth 2 (two) times daily.   Yes Historical Provider, MD  traZODone (DESYREL) 100 MG tablet Take 300 mg by mouth at bedtime.   Yes Historical Provider, MD  aspirin EC 81 MG tablet Take 1 tablet (81 mg total) by mouth daily. 10/27/15   [name], MD  clindamycin (CLEOCIN) 300 MG capsule Take 1 capsule (300 mg total) by mouth 4 (four) times daily. 10/27/15   [name], MD  vitamin B-12 500 MCG tablet Take 1 tablet (500 mcg total) by mouth daily. 10/27/15   [name], MD   Physical Exam  BP 100/71 mmHg  Pulse 72  Temp(Src) 97.6 F (36.4 C) (Axillary)  Resp 16  Wt 169 lb 8.5 oz (76.9 kg)  SpO2 95% Physical Exam  Constitutional: She is oriented to person, place, and time. She appears well-developed and well-nourished.  Non-toxic appearance. She does not appear ill. No distress.  HENT:  Head: Atraumatic. Head is without right periorbital erythema and without left periorbital erythema.  Right Ear: Tympanic membrane and ear canal normal. No tenderness. No mastoid tenderness. Tympanic membrane is not injected and not erythematous. No middle ear effusion.  Left Ear: Tympanic membrane, external ear and ear canal normal. No tenderness. No mastoid tenderness. Tympanic membrane is not injected and not erythematous.  No middle ear effusion.  Mouth/Throat: Uvula is midline. She does not have dentures. No oral lesions. No trismus in the jaw. Abnormal dentition. No dental abscesses, uvula swelling or lacerations. No oropharyngeal exudate, posterior oropharyngeal edema, posterior oropharyngeal erythema or tonsillar abscesses.  Eyes: Pupils are equal, round, and reactive to light. No scleral icterus.  Neck: Normal range of motion and full passive range of motion without pain. Neck supple. No tracheal tenderness present. No rigidity. No tracheal deviation present. No Brudzinski's sign and no Kernig's sign noted. No thyroid mass and no  thyromegaly present.    Cardiovascular: Normal heart sounds and intact distal pulses.   No murmur heard. Pulmonary/Chest: Effort normal and breath sounds normal. No stridor. No respiratory distress. She has no wheezes. She has no rales.  Abdominal: Soft. Bowel sounds are normal. She exhibits no distension. There is no tenderness. There is no rebound and no guarding.  Musculoskeletal: Normal range of motion.  Neurological: She is alert and oriented to person, place, and time. She has normal reflexes. No cranial nerve deficit or sensory deficit.  4/5 in RUE and 4/5 in LUE.  Skin: Skin is warm and dry. No pallor.  Psychiatric: She has a normal mood and affect. Her behavior is normal.   ED Course  Procedures Labs Review Labs Reviewed  CBC WITH DIFFERENTIAL/PLATELET - Abnormal; Notable for the following:    Neutro Abs 7.8 (*)    All other components within normal limits  BASIC METABOLIC PANEL - Abnormal; Notable for the following:  Potassium 3.3 (*)    Glucose, Bld 104 (*)    Calcium 8.6 (*)    All other components within normal limits  LIPID PANEL - Abnormal; Notable for the following:    Triglycerides 165 (*)    HDL 32 (*)    All other components within normal limits  GLUCOSE, CAPILLARY - Abnormal; Notable for the following:    Glucose-Capillary 115 (*)    All other components within normal limits  BASIC METABOLIC PANEL - Abnormal; Notable for the following:    Calcium 8.7 (*)    All other components within normal limits  CBC - Abnormal; Notable for the following:    RBC 3.66 (*)    Hemoglobin 11.0 (*)    HCT 34.1 (*)    All other components within normal limits  HEMOGLOBIN A1C  BRAIN NATRIURETIC PEPTIDE  GLUCOSE, CAPILLARY  GLUCOSE, CAPILLARY  TSH  RPR  HIV ANTIBODY (ROUTINE TESTING)  VITAMIN B12  FOLATE RBC    Imaging Review Mr Cervical Spine Wo Contrast  10/26/2015  CLINICAL DATA:  Left-sided numbness. EXAM: MRI CERVICAL SPINE WITHOUT CONTRAST TECHNIQUE:  Multiplanar, multisequence MR imaging of the cervical spine was performed. No intravenous contrast was administered. COMPARISON:  07/03/2010 FINDINGS: No marrow signal abnormality suggestive of fracture, infection, or neoplasm. Chronic calvarial thickening and dural thickening in the posterior fossa. Related engorged appearance of the cervical venous plexus, giving an effaced appearance to the thecal sac and prominent cord size. When compared to prior, there is no indication of cord swelling and cord signal is normal. 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 This is stable since at least 2010. No notable degenerative change in the cervical spine. No stenosis or impingement. IMPRESSION: 1. No impingement or cord lesion to explain sensory deficit. 2. Chronic calvarial and dural thickening, presumably shunt related. Electronically Signed   By: Marnee SpringJonathon  Watts M.D.   On: 10/26/2015 18:31    Laboratory and Imaging results were personally reviewed by myself and used in the medical decision making of this patient's treatment and disposition.  EKG Interpretation  EKG Interpretation  Date/Time:    Ventricular Rate:    PR Interval:    QRS Duration:   QT Interval:    QTC Calculation:   R Axis:     Text Interpretation:        MDM  Kelli Miranda is a 39 y.o. female with H&P as above. ED clinical course as follows:  My review of patient's PMHx reveals HTN, DM, VP shunt and COPD. Patient had previous right thalamic stroke (4 months ago). Following CVA patient had residual left sided weakness.   Today, patient presents with RUE weakness of onset of 2 days. Therefore I obtained Head CT which I personally visualized which showed NAICA.  Due to concern for abscess vs cellulitis vs deep space infection of right cheek, I obtained CT maxillofacial w/ contrast. This Imaging reveals concern for  inflammation of the parotid gland and superficial cellulitis of cheek. Patient therefore in light of DM will require IV ABx as inpatient.   Following this I obtained consultation with the Neurology and Hospitalist service for concerns of cellulitis in Diabetic and new onset neurologic findings. I discussed the patients clinical course including their H&P, as well as, their diagnostic studies. Based upon that discussion, we've decided that the patient will require admission for further workup and evaluation for stroke reduction.  Clinical Impression:  1. Facial cellulitis   2. Enlarged parotid gland   3. Weakness of left upper extremity   4. Left sided numbness    Disposition:  Admit  Patient care discussed with Dr. Clarene Duke, who oversaw their evaluation & treatment & voiced agreement. House Officer: Jonette Eva, MD, Emergency Medicine.  Jonette Eva, MD 10/28/15 0410  Laurence Spates, MD 10/30/15 [phone]

## 2015-10-25 NOTE — ED Notes (Signed)
Patient transported to MRI 

## 2015-10-25 NOTE — ED Notes (Signed)
Patient transported to CT 

## 2015-10-26 ENCOUNTER — Inpatient Hospital Stay (HOSPITAL_COMMUNITY): Payer: Medicare Other

## 2015-10-26 DIAGNOSIS — L03211 Cellulitis of face: Secondary | ICD-10-CM

## 2015-10-26 DIAGNOSIS — I639 Cerebral infarction, unspecified: Secondary | ICD-10-CM

## 2015-10-26 DIAGNOSIS — R29898 Other symptoms and signs involving the musculoskeletal system: Secondary | ICD-10-CM

## 2015-10-26 DIAGNOSIS — K111 Hypertrophy of salivary gland: Secondary | ICD-10-CM

## 2015-10-26 DIAGNOSIS — M6289 Other specified disorders of muscle: Secondary | ICD-10-CM

## 2015-10-26 DIAGNOSIS — R739 Hyperglycemia, unspecified: Secondary | ICD-10-CM

## 2015-10-26 DIAGNOSIS — I6789 Other cerebrovascular disease: Secondary | ICD-10-CM

## 2015-10-26 DIAGNOSIS — E876 Hypokalemia: Secondary | ICD-10-CM

## 2015-10-26 DIAGNOSIS — R209 Unspecified disturbances of skin sensation: Secondary | ICD-10-CM

## 2015-10-26 LAB — GLUCOSE, CAPILLARY
Glucose-Capillary: 88 mg/dL (ref 65–99)
Glucose-Capillary: 115 mg/dL — ABNORMAL HIGH (ref 65–99)
Glucose-Capillary: 89 mg/dL (ref 65–99)

## 2015-10-26 LAB — LIPID PANEL
CHOLESTEROL: 155 mg/dL (ref 0–200)
HDL: 32 mg/dL — AB (ref >40)
LDL Cholesterol: 90 mg/dL (ref 0–99)
TRIGLYCERIDES: 165 mg/dL — AB (ref <150)
Total CHOL/HDL Ratio: 4.8 RATIO
VLDL: 33 mg/dL (ref 0–40)

## 2015-10-26 LAB — BRAIN NATRIURETIC PEPTIDE: B NATRIURETIC PEPTIDE 5: 98.9 pg/mL (ref 0.0–100.0)

## 2015-10-26 MED ORDER — GABAPENTIN 300 MG PO CAPS
300.0000 mg | ORAL_CAPSULE | Freq: Three times a day (TID) | ORAL | Status: DC
  Administered 2015-10-26 – 2015-10-27 (×4): 300 mg via ORAL
  Filled 2015-10-26 (×4): qty 1

## 2015-10-26 MED ORDER — STROKE: EARLY STAGES OF RECOVERY BOOK
Freq: Once | Status: AC
  Administered 2015-10-26: 01:00:00

## 2015-10-26 MED ORDER — ACETAMINOPHEN 500 MG PO TABS
500.0000 mg | ORAL_TABLET | Freq: Three times a day (TID) | ORAL | Status: DC | PRN
  Administered 2015-10-26 – 2015-10-27 (×2): 500 mg via ORAL
  Filled 2015-10-26 (×2): qty 1

## 2015-10-26 MED ORDER — ASPIRIN 300 MG RE SUPP: 300.0000 mg | Freq: Every day | RECTAL | Status: DC

## 2015-10-26 MED ORDER — DEXTROSE 5 % IV SOLN
1.0000 g | INTRAVENOUS | Status: DC
  Administered 2015-10-26: 1 g via INTRAVENOUS
  Filled 2015-10-26 (×2): qty 10

## 2015-10-26 MED ORDER — PROMETHAZINE HCL 25 MG PO TABS: 25.0000 mg | ORAL_TABLET | Freq: Four times a day (QID) | ORAL | Status: DC | PRN

## 2015-10-26 MED ORDER — SODIUM CHLORIDE 0.9 % IV SOLN
250.0000 mg | Freq: Once | INTRAVENOUS | Status: AC
  Administered 2015-10-26: 250 mg via INTRAVENOUS
  Filled 2015-10-26: qty 2.5

## 2015-10-26 MED ORDER — SENNOSIDES-DOCUSATE SODIUM 8.6-50 MG PO TABS: 1.0000 | ORAL_TABLET | Freq: Every evening | ORAL | Status: DC | PRN

## 2015-10-26 MED ORDER — QUETIAPINE FUMARATE 50 MG PO TABS
100.0000 mg | ORAL_TABLET | Freq: Every day | ORAL | Status: DC
Start: 2015-10-26 — End: 2015-10-27
  Administered 2015-10-26: 100 mg via ORAL
  Filled 2015-10-26: qty 2

## 2015-10-26 MED ORDER — ASPIRIN 325 MG PO TABS
325.0000 mg | ORAL_TABLET | Freq: Every day | ORAL | Status: DC
  Administered 2015-10-26 – 2015-10-27 (×2): 325 mg via ORAL
  Filled 2015-10-26 (×2): qty 1

## 2015-10-26 MED ORDER — MOMETASONE FURO-FORMOTEROL FUM 100-5 MCG/ACT IN AERO
2.0000 | INHALATION_SPRAY | Freq: Two times a day (BID) | RESPIRATORY_TRACT | Status: DC
  Administered 2015-10-26 – 2015-10-27 (×3): 2 via RESPIRATORY_TRACT
  Filled 2015-10-26: qty 8.8

## 2015-10-26 MED ORDER — PREGABALIN 75 MG PO CAPS
150.0000 mg | ORAL_CAPSULE | Freq: Three times a day (TID) | ORAL | Status: DC
  Administered 2015-10-26 – 2015-10-27 (×4): 150 mg via ORAL
  Filled 2015-10-26 (×4): qty 2

## 2015-10-26 MED ORDER — TOPIRAMATE 100 MG PO TABS
400.0000 mg | ORAL_TABLET | Freq: Two times a day (BID) | ORAL | Status: DC
  Administered 2015-10-26 – 2015-10-27 (×3): 400 mg via ORAL
  Filled 2015-10-26 (×3): qty 4

## 2015-10-26 MED ORDER — CLINDAMYCIN PHOSPHATE 900 MG/50ML IV SOLN
900.0000 mg | Freq: Three times a day (TID) | INTRAVENOUS | Status: DC
  Administered 2015-10-26 – 2015-10-27 (×3): 900 mg via INTRAVENOUS
  Filled 2015-10-26 (×5): qty 50

## 2015-10-26 MED ORDER — POTASSIUM CHLORIDE CRYS ER 20 MEQ PO TBCR
40.0000 meq | EXTENDED_RELEASE_TABLET | Freq: Once | ORAL | Status: AC
  Administered 2015-10-26: 40 meq via ORAL
  Filled 2015-10-26: qty 2

## 2015-10-26 MED ORDER — ALPRAZOLAM 0.5 MG PO TABS
0.5000 mg | ORAL_TABLET | Freq: Every evening | ORAL | Status: DC | PRN
  Administered 2015-10-26: 0.5 mg via ORAL
  Filled 2015-10-26: qty 1

## 2015-10-26 MED ORDER — ENOXAPARIN SODIUM 40 MG/0.4ML ~~LOC~~ SOLN
40.0000 mg | SUBCUTANEOUS | Status: DC
  Administered 2015-10-26 – 2015-10-27 (×2): 40 mg via SUBCUTANEOUS
  Filled 2015-10-26 (×2): qty 0.4

## 2015-10-26 MED ORDER — TRAZODONE HCL 100 MG PO TABS
300.0000 mg | ORAL_TABLET | Freq: Every day | ORAL | Status: DC
  Administered 2015-10-26: 300 mg via ORAL
  Filled 2015-10-26: qty 3

## 2015-10-26 MED ORDER — LEVETIRACETAM 250 MG PO TABS
250.0000 mg | ORAL_TABLET | Freq: Two times a day (BID) | ORAL | Status: DC
Start: 2015-10-26 — End: 2015-10-27
  Administered 2015-10-26 – 2015-10-27 (×3): 250 mg via ORAL
  Filled 2015-10-26 (×3): qty 1

## 2015-10-26 MED ORDER — FLUOXETINE HCL 20 MG PO TABS
60.0000 mg | ORAL_TABLET | Freq: Every day | ORAL | Status: DC
  Administered 2015-10-26 – 2015-10-27 (×2): 60 mg via ORAL
  Filled 2015-10-26 (×4): qty 3

## 2015-10-26 NOTE — Progress Notes (Signed)
Subjective: Patient continues to complain of numbness.  This numbness is on the right upper and lower extremity.  No involvement of the face.  Remains lethargic.  Objective: Current vital signs: BP 96/61 mmHg  Pulse 80  Temp(Src) 98.2 F (36.8 C) (Oral)  Resp 18  Wt 76.9 kg (169 lb 8.5 oz)  SpO2 93% Vital signs in last 24 hours: Temp:  [97.5 F (36.4 C)-99.5 F (37.5 C)] 98.2 F (36.8 C) (10/27 0651) Pulse Rate:  [79-116] 80 (10/27 0651) Resp:  [16-18] 18 (10/27 0056) BP: (96-121)/(30-82) 96/61 mmHg (10/27 0651) SpO2:  [92 %-99 %] 93 % (10/27 0651) Weight:  [76.9 kg (169 lb 8.5 oz)] 76.9 kg (169 lb 8.5 oz) (10/27 0056)  Intake/Output from previous day:   Intake/Output this shift:   Nutritional status: Diet heart healthy/carb modified Room service appropriate?: Yes; Fluid consistency:: Thin  Neurologic Exam: Mental Status: Lethargic, oriented, thought content appropriate. Speech fluent without evidence of aphasia. Able to follow 3 step commands without difficulty. Cranial Nerves: II: Discs flat bilaterally; Visual fields grossly normal, pupils equal, round, reactive to light and accommodation III,IV, VI: ptosis not present, extra-ocular motions intact bilaterally V,VII: smile symmetric, facial light touch sensation normal bilaterally VIII: hearing normal bilaterally IX,X: uvula rises symmetrically XI: bilateral shoulder shrug XII: midline tongue extension without atrophy or fasciculations Motor: Uses both upper extremities with no focal weakness noted with spontaneous movement but when tested formally gives 3/5 right hand grip. Lifts each leg with 3/5 strength.     Sensory: Pinprick and light touch decreased on the right   Lab Results: Basic Metabolic Panel:  Recent Labs Lab 10/25/15 2015  NA 135  K 3.3*  CL 103  CO2 23  GLUCOSE 104*  BUN 6  CREATININE 0.78  CALCIUM 8.6*    Liver Function Tests: No results for input(s): AST, ALT, ALKPHOS, BILITOT, PROT,  ALBUMIN in the last 168 hours. No results for input(s): LIPASE, AMYLASE in the last 168 hours. No results for input(s): AMMONIA in the last 168 hours.  CBC:  Recent Labs Lab 10/25/15 2015  WBC 9.9  NEUTROABS 7.8*  HGB 12.3  HCT 37.5  MCV 93.8  PLT 172    Cardiac Enzymes: No results for input(s): CKTOTAL, CKMB, CKMBINDEX, TROPONINI in the last 168 hours.  Lipid Panel:  Recent Labs Lab 10/26/15 0430  CHOL 155  TRIG 165*  HDL 32*  CHOLHDL 4.8  VLDL 33  LDLCALC 90    CBG:  Recent Labs Lab 10/26/15 0719  GLUCAP 88    Microbiology: No results found for this or any previous visit.  Coagulation Studies: No results for input(s): LABPROT, INR in the last 72 hours.  Imaging: Ct Head Wo Contrast  10/25/2015  CLINICAL DATA:  RIGHT-sided facial swelling, stroke-like symptoms with RIGHT upper extremity weakness for 2 days. History of diabetes, hypertension and stroke. EXAM: CT HEAD WITHOUT CONTRAST CT MAXILLOFACIAL WITH CONTRAST TECHNIQUE: Multidetector CT imaging of the head was performed using the standard protocol without IV contrast. CT imaging of the maxillofacial structures was performed with intravenous contrast. Multiplanar CT image reconstructions were also generated. CONTRAST:  75mL OMNIPAQUE IOHEXOL 300 MG/ML  SOLN COMPARISON:  CT head June 29, 2015 MRI of the brain June 29, 2015 FINDINGS: CT HEAD FINDINGS No intraparenchymal hemorrhage, mass effect, midline shift or acute large vascular territory infarct. 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 Ventriculoperitoneal shunt via a RIGHT parietal burr hole, with distal tip terminating in LEFT frontal horn, unchanged. Mild to  moderate ventriculomegaly, decreased from prior imaging, no CT findings of hydrocephalus. No abnormal extra-axial fluid collections. Basal cisterns are patent. No skull fracture. CT MAXILLOFACIAL FINDINGS RIGHT facial/ preauricular mild soft tissue swelling, reticulated fat with minimal inflammatory  changes superficial lobe of the RIGHT parotid gland. No parotid masses, ductal dilatation or sialolith. No radiopaque foreign bodies. Ocular globes and orbital contents are normal. Paranasal sinuses and mastoid air cells are well aerated. RIGHT jugular bulb dehiscence. No destructive bony lesions. Included airway is patent. Ventriculoperitoneal shunt courses through RIGHT neck, visualized portion is contiguous. Possible cerebellar DVA use though, incompletely characterized. IMPRESSION: CT HEAD: No acute intracranial process. Slightly decreased ventriculomegaly, stable positioning of ventriculoperitoneal shunt. 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 CT MAXILLOFACIAL: Mild RIGHT facial/preauricular soft tissue swelling most compatible cellulitis without drainable fluid collection. Mild inflammation of the parotid gland is likely reactive, less likely parotiditis. Electronically Signed   By: Awilda Metro M.D.   On: 10/25/2015 22:35   Mr Brain Wo Contrast  10/26/2015  CLINICAL DATA:  New onset RIGHT-sided weakness. History of ventriculoperitoneal shunt and stroke 4 months ago. History of diabetes, hypertension. EXAM: MRI HEAD WITHOUT CONTRAST TECHNIQUE: Multiplanar, multiecho pulse sequences of the brain and surrounding structures were obtained without intravenous contrast. COMPARISON:  CT head October 25, 2015 at 2133 hours and MRI of the brain June 30th 2016 and MRI of the brain July 03, 2010 FINDINGS: No reduced diffusion to suggest acute ischemia. No susceptibility artifact is suggests hemorrhage. Ventriculoperitoneal shunt via RIGHT parietal approach with distal tip in the frontal horn of LEFT lateral ventricle. Mild gliosis along the catheter tract, unchanged. Slightly decreased ventricle size from prior imaging, no hydrocephalus. Cystic RIGHT thalamic lacunar infarct which was acute on prior MRI. Minimal nonspecific white matter T2 hyperintensities. No midline shift, mass effect or mass lesions. Chronic  trace holohemispheric subdural hematomas with intermediate signal. No new extra-axial fluid collection. Normal major intracranial vascular flow voids seen at the skull base. Included ocular globes and orbital contents are unremarkable. Paranasal sinuses and mastoid air cells are well aerated. No abnormal sellar expansion. No suspicious calvarial bone marrow signal. IMPRESSION: No acute intracranial process, specifically no acute ischemia. 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 RIGHT ventriculoperitoneal shunt in place, no hydrocephalus. 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 Electronically Signed   By: Awilda Metro M.D.   On: 10/26/2015 00:27   Ct Maxillofacial W/cm  10/25/2015  CLINICAL DATA:  RIGHT-sided facial swelling, stroke-like symptoms with RIGHT upper extremity weakness for 2 days. History of diabetes, hypertension and stroke. EXAM: CT HEAD WITHOUT CONTRAST CT MAXILLOFACIAL WITH CONTRAST TECHNIQUE: Multidetector CT imaging of the head was performed using the standard protocol without IV contrast. CT imaging of the maxillofacial structures was performed with intravenous contrast. Multiplanar CT image reconstructions were also generated. CONTRAST:  75mL OMNIPAQUE IOHEXOL 300 MG/ML  SOLN COMPARISON:  CT head June 29, 2015 MRI of the brain June 29, 2015 FINDINGS: CT HEAD FINDINGS No intraparenchymal hemorrhage, mass effect, midline shift or acute large vascular territory infarct. 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 Ventriculoperitoneal shunt via a RIGHT parietal burr hole, with distal tip terminating in LEFT frontal horn, unchanged. Mild to moderate ventriculomegaly, decreased from prior imaging, no CT findings of hydrocephalus. No abnormal extra-axial fluid collections. Basal cisterns are patent. No skull fracture. CT MAXILLOFACIAL FINDINGS RIGHT facial/ preauricular mild soft tissue swelling, reticulated fat with minimal inflammatory changes superficial lobe of the RIGHT  parotid gland. No parotid masses, ductal dilatation or sialolith. No radiopaque  foreign bodies. Ocular globes and orbital contents are normal. Paranasal sinuses and mastoid air cells are well aerated. RIGHT jugular bulb dehiscence. No destructive bony lesions. Included airway is patent. Ventriculoperitoneal shunt courses through RIGHT neck, visualized portion is contiguous. Possible cerebellar DVA use though, incompletely characterized. IMPRESSION: CT HEAD: No acute intracranial process. Slightly decreased ventriculomegaly, stable positioning of ventriculoperitoneal shunt. 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 CT MAXILLOFACIAL: Mild RIGHT facial/preauricular soft tissue swelling most compatible cellulitis without drainable fluid collection. Mild inflammation of the parotid gland is likely reactive, less likely parotiditis. Electronically Signed   By: Awilda Metroourtnay  Bloomer M.D.   On: 10/25/2015 22:35   Dg Chest Portable 1 View  10/25/2015  CLINICAL DATA:  Swelling on the right side of the face for 3 days. VP shunt placed in 2000. History of stroke and left-sided weakness. EXAM: PORTABLE CHEST 1 VIEW COMPARISON:  Chest radiograph 07/02/2010 FINDINGS: Prominent interstitial lung markings bilaterally with mild peribronchial thickening. Mild fullness in the perihilar regions. Heart size is within normal limits. The trachea is midline. There is a right VP shunt. IMPRESSION: Prominent interstitial lung markings with perihilar fullness. Findings are concerning for mild pulmonary edema. Electronically Signed   By: Richarda OverlieAdam  Henn M.D.   On: 10/25/2015 21:28    Medications:  I have reviewed the patient's current medications. Scheduled: . aspirin  300 mg Rectal Daily   Or  . aspirin  325 mg Oral Daily  . cefTRIAXone (ROCEPHIN)  IV  1 g Intravenous Q24H  . enoxaparin (LOVENOX) injection  40 mg Subcutaneous Q24H  . FLUoxetine  60 mg Oral Daily  . gabapentin  300 mg Oral TID  . levETIRAcetam  250 mg Oral BID  .  mometasone-formoterol  2 puff Inhalation BID  . pregabalin  150 mg Oral TID  . QUEtiapine  100 mg Oral QHS  . topiramate  400 mg Oral BID  . traZODone  300 mg Oral QHS    Assessment/Plan: Patient continues to complain of left sided numbness.  Today with patient more alert reports that it involves the upper and lower extremity and has been present for at least 4 days.  MRI of the brain personally reviewed and shows no acute changes.  Patient on ASA.  Will evaluate cervical spine as well.    Recommendations: 1.  MRI of the cervical spine   LOS: 1 day   Thana FarrLeslie Oyinkansola Truax, MD [facility] [phone] 10/26/2015  10:16 AM

## 2015-10-26 NOTE — Progress Notes (Signed)
  Echocardiogram 2D Echocardiogram has been performed.  Kelli Miranda, Kelli Miranda R 10/26/2015, 9:51 AM

## 2015-10-26 NOTE — Progress Notes (Signed)
Pt arrived to unit with drowsiness from Ativan given for MRI.  Wakes up to voice. VS obtained. Call bell at side and bed alarm activated. Will continue to monitor. [name], [name], RN

## 2015-10-26 NOTE — Evaluation (Signed)
SLP Cancellation Note  Patient Details Name: Kelli Miranda MRN: [id number] DOB: 1976/12/02   Cancelled treatment:       Reason Eval/Treat Not Completed: Patient at procedure or test/unavailable (pt having ECHO completed,  will reattempt as schedule allows)   Donavan Burnetamara Jakell Trusty, MS [facility] S[facility] [phone]

## 2015-10-26 NOTE — Evaluation (Signed)
Physical Therapy Evaluation Patient Details Name: Kelli Miranda MRN: [id number] DOB: Jun 16, 1976 Today's Date: 10/26/2015   History of Present Illness  Kelli Miranda is an 39 y.o. female with a past medical history significant for HTN, DM, heavy smoker, ischemic right thalamic stroke approximately 4 months ago without residual mild left sided weakness, deficit, ventriculoperitoneal shunt, and COPD, sent to the ED from her PCP office for evaluation of right facial swelling. In ED pt also reported RUE weakness and numbness MRI head negative    Clinical Impression  Pt admitted with above symptoms. ?effort on patient's part during evaluation. Pt's family has been providing care for pt at wheelchair and RW level. Present during eval and state they can continue to provide care and want to take pt home today. Pt may benefit from skilled PT to increase their independence and safety with mobility in her home environment.    Follow Up Recommendations Home health PT (pt prefers [facility])    Equipment Recommendations  None recommended by PT    Recommendations for Other Services       Precautions / Restrictions Precautions Precautions: Fall      Mobility  Bed Mobility Overal bed mobility: Modified Independent             General bed mobility comments: incr effort and time; HOB slightly elevated; initially waited for someone to pick her legs up and put them on the bed, then used her RLE to assist her LLE  Transfers Overall transfer level: Needs assistance Equipment used: Rolling walker (2 wheeled) Transfers: Sit to/from Stand Sit to Stand: Min guard         General transfer comment: pt insists on pushing down on handles of RW; minguard to assure RW steady  Ambulation/Gait Ambulation/Gait assistance: Min assist Ambulation Distance (Feet): 10 Feet Assistive device: Rolling walker (2 wheeled) Gait Pattern/deviations: Step-to pattern;Decreased stride length;Wide base of  support Gait velocity: very slow Gait velocity interpretation: <1.8 ft/sec, indicative of risk for recurrent falls General Gait Details: initial steps with RW slow, but LEs fully supporting her; after 3 ft she began to demonstrate slow "buckling" of bil knees and with cues she could extend hips and knees to return to upright; reports buckling due to bil thigh pain and weakness  Stairs Stairs:  (dtr's fiance picks her up and carries her)          Wheelchair Mobility    Modified Rankin (Stroke Patients Only)       Balance Overall balance assessment: Needs assistance Sitting-balance support: Feet unsupported Sitting balance-Leahy Scale: Fair     Standing balance support: Bilateral upper extremity supported Standing balance-Leahy Scale: Poor                               Pertinent Vitals/Pain Pain Assessment: Faces Faces Pain Scale: Hurts even more Pain Location: bil legs/thighs Pain Descriptors / Indicators: Aching Pain Intervention(s): Limited activity within patient's tolerance;Monitored during session;Repositioned    Home Living Family/patient expects to be discharged to:: Private residence Living Arrangements: Children;Other relatives (daughter and her fiance) Available Help at Discharge: Family;Personal care attendant (HHaide 8 hrs per day) Type of Home: House Home Access: Stairs to enter Entrance Stairs-Rails: Right;Left Entrance Stairs-Number of Steps: 3 (fiance reports ramp to be built this weekend 10/29)   Home Equipment: Gilford Rile - 4 wheels;Bedside commode;Wheelchair - manual      Prior Function Level of Independence: Needs assistance   Gait /  Transfers Assistance Needed: walks with 4 wheeled walker only with assist and short distances (~15-20 ft); also uses wheelchair if alone           Hand Dominance        Extremity/Trunk Assessment   Upper Extremity Assessment: Defer to OT evaluation;Overall [facility] for tasks assessed (supported herself  on RW bil UEs)           Lower Extremity Assessment: RLE deficits/detail;LLE deficits/detail RLE Deficits / Details: AROM WFL (bradykinesia); demonstrates 3/5 strength (?effort) LLE Deficits / Details: AROM WFL (bradykinesia); demonstrates 3/5 strength (?effort)  Cervical / Trunk Assessment: Normal (able to pull up from supine to longsitting no UE assist)  Communication   Communication: No difficulties  Cognition Arousal/Alertness: Awake/alert Behavior During Therapy: Impulsive Overall Cognitive Status: Within Functional Limits for tasks assessed (shows decr safety awareness as PTA)                      General Comments General comments (skin integrity, edema, etc.): Daughter and her fiance present throughout. Report her walking seems weaker, but they can manage at home as they have been doing    Exercises        Assessment/Plan    PT Assessment All further PT needs can be met in the next venue of care  PT Diagnosis Generalized weakness   PT Problem List Decreased strength;Decreased activity tolerance;Decreased balance;Decreased mobility;Impaired sensation;Pain;Obesity  PT Treatment Interventions     PT Goals (Current goals can be found in the Care Plan section) Acute Rehab PT Goals Patient Stated Goal: return home today with [facility] agency PT Goal Formulation: All assessment and education complete, DC therapy    Frequency     Barriers to discharge        Co-evaluation               End of Session Equipment Utilized During Treatment: Gait belt Activity Tolerance: Patient limited by pain;Patient limited by fatigue Patient left: in bed;with call bell/phone within reach;with family/visitor present (pt refused bed alarm; promised to repeatedly set it off inte)           Time: 6619-6940 PT Time Calculation (min) (ACUTE ONLY): 21 min   Charges:   PT Evaluation $Initial PT Evaluation Tier I: 1 Procedure     PT G Codes:         Jace Dowe 11/22/15, 12:17 PM Pager [phone]

## 2015-10-26 NOTE — Progress Notes (Addendum)
PROGRESS NOTE  Kelli Miranda:[id number] DOB: [birth year] DOA: 10/25/2015 PCP: PROVIDER NOT IN SYSTEM Brief history 39 year old female with a history of right thalamic lacunar infarct, diabetes mellitus, COPD, tobacco abuse, hypertension, VP shunt presents with 2-3 day history of right upper extremity, right lower extremity weakness with associated dysesthesias. After admission, the patient states that she still has some right upper extremity or lower extremity weakness which has somewhat improved but still has numbness and tingling. The patient denies any recent injury or trauma. MRI of the brain was obtained and revealed an old right thalamic lacunar infarct without acute findings. There was trace chronic bilateral SDH.  Patient states that she recently finished a course of prednisone, antibiotics for presumed bronchitis. She finished approximately 3 days prior to the admission.  Assessment/Plan: Right upper shin or weakness/sensory disturbance -10/26/2015 MRI brain negative for acute infarct but shows trace chronic bilateral holocephalic SDH -PT-->HHPT -appreciate neurology consult -10/26/2015 MRI brain negative for acute infarct -MR cervical spine -LDL 90 -continue ASA -check B12, HIV, RPR, TSH, RBC folate -10/26/2015 echo EF 55-60%, no WMA Facial cellulitis -improving on abx -?parotitis--minimal tenderness on exam Hyperglycemia -HbA1C Depression/Anxiety -continue fluoxetine, gabapentin, seroquel, topamax, trazadone COPD -stable on RA -continue LABA Tobacco abuse -Tobacco cessation discussed -Patient declined NicoDerm patch Hx of stroke -continue ASA Hypokalemia -Replete -Check magnesium  Family Communication:   Daughter updated at beside Disposition Plan:   Home 10/28 if medically stable Total time 60 min 1130-1230      Procedures/Studies: Ct Head Wo Contrast  10/25/2015  CLINICAL DATA:  RIGHT-sided facial swelling, stroke-like symptoms with  RIGHT upper extremity weakness for 2 days. History of diabetes, hypertension and stroke. EXAM: CT HEAD WITHOUT CONTRAST CT MAXILLOFACIAL WITH CONTRAST TECHNIQUE: Multidetector CT imaging of the head was performed using the standard protocol without IV contrast. CT imaging of the maxillofacial structures was performed with intravenous contrast. Multiplanar CT image reconstructions were also generated. CONTRAST:  75mL OMNIPAQUE IOHEXOL 300 MG/ML  SOLN COMPARISON:  CT head June 29, 2015 MRI of the brain June 29, 2015 FINDINGS: CT HEAD FINDINGS No intraparenchymal hemorrhage, mass effect, midline shift or acute large vascular territory infarct. 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 Ventriculoperitoneal shunt via a RIGHT parietal burr hole, with distal tip terminating in LEFT frontal horn, unchanged. Mild to moderate ventriculomegaly, decreased from prior imaging, no CT findings of hydrocephalus. No abnormal extra-axial fluid collections. Basal cisterns are patent. No skull fracture. CT MAXILLOFACIAL FINDINGS RIGHT facial/ preauricular mild soft tissue swelling, reticulated fat with minimal inflammatory changes superficial lobe of the RIGHT parotid gland. No parotid masses, ductal dilatation or sialolith. No radiopaque foreign bodies. Ocular globes and orbital contents are normal. Paranasal sinuses and mastoid air cells are well aerated. RIGHT jugular bulb dehiscence. No destructive bony lesions. Included airway is patent. Ventriculoperitoneal shunt courses through RIGHT neck, visualized portion is contiguous. Possible cerebellar DVA use though, incompletely characterized. IMPRESSION: CT HEAD: No acute intracranial process. Slightly decreased ventriculomegaly, stable positioning of ventriculoperitoneal shunt. 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 CT MAXILLOFACIAL: Mild RIGHT facial/preauricular soft tissue swelling most compatible cellulitis without drainable fluid collection. Mild inflammation of the parotid gland is  likely reactive, less likely parotiditis. Electronically Signed   By: Awilda Metro M.D.   On: 10/25/2015 22:35   Mr Brain Wo Contrast  10/26/2015  CLINICAL DATA:  New onset RIGHT-sided weakness. History of ventriculoperitoneal shunt and stroke 4 months ago. History of diabetes, hypertension. EXAM: MRI  HEAD WITHOUT CONTRAST TECHNIQUE: Multiplanar, multiecho pulse sequences of the brain and surrounding structures were obtained without intravenous contrast. COMPARISON:  CT head October 25, 2015 at 2133 hours and MRI of the brain June 30th 2016 and MRI of the brain July 03, 2010 FINDINGS: No reduced diffusion to suggest acute ischemia. No susceptibility artifact is suggests hemorrhage. Ventriculoperitoneal shunt via RIGHT parietal approach with distal tip in the frontal horn of LEFT lateral ventricle. Mild gliosis along the catheter tract, unchanged. Slightly decreased ventricle size from prior imaging, no hydrocephalus. Cystic RIGHT thalamic lacunar infarct which was acute on prior MRI. Minimal nonspecific white matter T2 hyperintensities. No midline shift, mass effect or mass lesions. Chronic trace holohemispheric subdural hematomas with intermediate signal. No new extra-axial fluid collection. Normal major intracranial vascular flow voids seen at the skull base. Included ocular globes and orbital contents are unremarkable. Paranasal sinuses and mastoid air cells are well aerated. No abnormal sellar expansion. No suspicious calvarial bone marrow signal. IMPRESSION: No acute intracranial process, specifically no acute ischemia. 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 RIGHT ventriculoperitoneal shunt in place, no hydrocephalus. 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 Electronically Signed   By: Awilda Metroourtnay  Bloomer M.D.   On: 10/26/2015 00:27   Ct Maxillofacial W/cm  10/25/2015  CLINICAL DATA:  RIGHT-sided facial swelling, stroke-like symptoms with RIGHT upper extremity weakness for 2 days.  History of diabetes, hypertension and stroke. EXAM: CT HEAD WITHOUT CONTRAST CT MAXILLOFACIAL WITH CONTRAST TECHNIQUE: Multidetector CT imaging of the head was performed using the standard protocol without IV contrast. CT imaging of the maxillofacial structures was performed with intravenous contrast. Multiplanar CT image reconstructions were also generated. CONTRAST:  75mL OMNIPAQUE IOHEXOL 300 MG/ML  SOLN COMPARISON:  CT head June 29, 2015 MRI of the brain June 29, 2015 FINDINGS: CT HEAD FINDINGS No intraparenchymal hemorrhage, mass effect, midline shift or acute large vascular territory infarct. 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 Ventriculoperitoneal shunt via a RIGHT parietal burr hole, with distal tip terminating in LEFT frontal horn, unchanged. Mild to moderate ventriculomegaly, decreased from prior imaging, no CT findings of hydrocephalus. No abnormal extra-axial fluid collections. Basal cisterns are patent. No skull fracture. CT MAXILLOFACIAL FINDINGS RIGHT facial/ preauricular mild soft tissue swelling, reticulated fat with minimal inflammatory changes superficial lobe of the RIGHT parotid gland. No parotid masses, ductal dilatation or sialolith. No radiopaque foreign bodies. Ocular globes and orbital contents are normal. Paranasal sinuses and mastoid air cells are well aerated. RIGHT jugular bulb dehiscence. No destructive bony lesions. Included airway is patent. Ventriculoperitoneal shunt courses through RIGHT neck, visualized portion is contiguous. Possible cerebellar DVA use though, incompletely characterized. IMPRESSION: CT HEAD: No acute intracranial process. Slightly decreased ventriculomegaly, stable positioning of ventriculoperitoneal shunt. 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 CT MAXILLOFACIAL: Mild RIGHT facial/preauricular soft tissue swelling most compatible cellulitis without drainable fluid collection. Mild inflammation of the parotid gland is likely reactive, less likely parotiditis.  Electronically Signed   By: Awilda Metroourtnay  Bloomer M.D.   On: 10/25/2015 22:35   Dg Chest Portable 1 View  10/25/2015  CLINICAL DATA:  Swelling on the right side of the face for 3 days. VP shunt placed in 2000. History of stroke and left-sided weakness. EXAM: PORTABLE CHEST 1 VIEW COMPARISON:  Chest radiograph 07/02/2010 FINDINGS: Prominent interstitial lung markings bilaterally with mild peribronchial thickening. Mild fullness in the perihilar regions. Heart size is within normal limits. The trachea is midline. There is a right VP shunt. IMPRESSION: Prominent interstitial lung markings with perihilar fullness. Findings are  concerning for mild pulmonary edema. Electronically Signed   By: Richarda Overlie M.D.   On: 10/25/2015 21:28         Subjective: Patient is complaining of right upper extremity and right lower extremity weakness with numbness and tingling. Denies any headache, visual disturbance, speech disturbance, chest discomfort, shortness breath, nausea, vomiting or diarrhea, abdominal pain, dysuria, hematochezia, melena.  Objective: Filed Vitals:   10/26/15 0243 10/26/15 0451 10/26/15 0651 10/26/15 1300  BP: 100/77 101/71 96/61 110/66  Pulse: 88 79 80 84  Temp: 97.5 F (36.4 C) 97.7 F (36.5 C) 98.2 F (36.8 C) 97.8 F (36.6 C)  TempSrc: Axillary Axillary Oral Oral  Resp:    18  Weight:      SpO2: 99% 95% 93% 94%   No intake or output data in the 24 hours ending 10/26/15 1625 Weight change:  Exam:   General:  Pt is alert, follows commands appropriately, not in acute distress  HEENT: No icterus, No thrush, No neck mass, [place]/AT  Cardiovascular: RRR, S1/S2, no rubs, no gallops  Respiratory: Minimal bibasilar rales without wheezing. Good air movement  Abdomen: Soft/+BS, non tender, non distended, no guarding  Extremities: No edema, No lymphangitis, No petechiae, No rashes, no synovitis  Data Reviewed: Basic Metabolic Panel:  Recent Labs Lab 10/25/15 2015  NA 135  K  3.3*  CL 103  CO2 23  GLUCOSE 104*  BUN 6  CREATININE 0.78  CALCIUM 8.6*   Liver Function Tests: No results for input(s): AST, ALT, ALKPHOS, BILITOT, PROT, ALBUMIN in the last 168 hours. No results for input(s): LIPASE, AMYLASE in the last 168 hours. No results for input(s): AMMONIA in the last 168 hours. CBC:  Recent Labs Lab 10/25/15 2015  WBC 9.9  NEUTROABS 7.8*  HGB 12.3  HCT 37.5  MCV 93.8  PLT 172   Cardiac Enzymes: No results for input(s): CKTOTAL, CKMB, CKMBINDEX, TROPONINI in the last 168 hours. BNP: Invalid input(s): POCBNP CBG:  Recent Labs Lab 10/26/15 0719 10/26/15 1134  GLUCAP 88 115*    No results found for this or any previous visit (from the past 240 hour(s)).   Scheduled Meds: . aspirin  300 mg Rectal Daily   Or  . aspirin  325 mg Oral Daily  . cefTRIAXone (ROCEPHIN)  IV  1 g Intravenous Q24H  . enoxaparin (LOVENOX) injection  40 mg Subcutaneous Q24H  . FLUoxetine  60 mg Oral Daily  . gabapentin  300 mg Oral TID  . levETIRAcetam  250 mg Oral BID  . mometasone-formoterol  2 puff Inhalation BID  . pregabalin  150 mg Oral TID  . QUEtiapine  100 mg Oral QHS  . topiramate  400 mg Oral BID  . traZODone  300 mg Oral QHS   Continuous Infusions:    Sheza Strickland, DO  [facility] Pager [phone]  If 7PM-7AM, please contact night-coverage [email or web address] Password [email or web address] 10/26/2015, 4:25 PM   LOS: 1 day

## 2015-10-26 NOTE — Progress Notes (Signed)
Utilization review completed. Nation Cradle, RN, BSN. 

## 2015-10-26 NOTE — Consult Note (Signed)
NEURO HOSPITALIST CONSULT NOTE   Referring physician: ED Reason for Consult: new onset right arm weakness  HPI:                                                                                                                                          Kelli Miranda is an 39 y.o. female with a past medical history significant for HTN, DM, heavy smoker, ischemic right thalamic stroke approximately 4 months ago without residual mild left sided weakness, deficit, ventriculoperitoneal shunt, and COPD, sent to the ED from her PCP office for evaluation of right facial swelling. During her ED assessment patient complained of right sided weakness especially in the hand. Patient is too drowsy after receiving IV ativan for MRI, answer some questions and falls back to sleep. She did tell me that the started noticing weakness and abnormal sensation " like tingling and numbness" of the right arm couple of days ago, and overall the weakness remains unchanged. As per chart review, she indicated concerns about having a stroke 2 days ago.  Denies HA, vertigo, double vision, focal numbness, imbalance, slurred speech, language or vision impairment. No neck pain, head or neck trauma. CT and MRI brain were personally reviewed and showed no acute intracranial abnormality.    Past Medical History  Diagnosis Date  . Stroke ([place])   . Diabetes mellitus without complication ([place])   . Hypertension   . COPD (chronic obstructive pulmonary disease) [facility])     Past Surgical History  Procedure Laterality Date  . Knee surgery    . Ventriculoperitoneal shunt      No family history on file.  Family History: no MS, brain tumor, or brain aneurysm   Social History:  reports that she has been smoking.  She does not have any smokeless tobacco history on file. She reports that she does not drink alcohol. Her drug history is not on file.  Allergies  Allergen Reactions  . Imitrex [Sumatriptan]      Hives   . Latex     Hives   . Percocet [Oxycodone-Acetaminophen]     Face swelling     MEDICATIONS:  Scheduled: .  stroke: mapping our early stages of recovery book   Does not apply Once  . aspirin  300 mg Rectal Daily   Or  . aspirin  325 mg Oral Daily  . cefTRIAXone (ROCEPHIN)  IV  1 g Intravenous Q24H  . enoxaparin (LOVENOX) injection  40 mg Subcutaneous Q24H  . FLUoxetine  60 mg Oral Daily  . gabapentin  300 mg Oral TID  . levETIRAcetam  250 mg Oral BID  . mometasone-formoterol  2 puff Inhalation BID  . pregabalin  150 mg Oral TID  . QUEtiapine  100 mg Oral QHS  . topiramate  400 mg Oral BID  . traZODone  300 mg Oral QHS     ROS:                                                                                                                                       History obtained from chart review and the patient  General ROS: negative for - chills, fatigue, fever, night sweats, weight gain or weight loss Psychological ROS: negative for - behavioral disorder, hallucinations, memory difficulties, mood swings or suicidal ideation Ophthalmic ROS: negative for - blurry vision, double vision, eye pain or loss of vision ENT ROS: negative for - epistaxis, nasal discharge, oral lesions, sore throat, tinnitus or vertigo Allergy and Immunology ROS: negative for - hives or itchy/watery eyes Hematological and Lymphatic ROS: negative for - bleeding problems, bruising or swollen lymph nodes Endocrine ROS: negative for - galactorrhea, hair pattern changes, polydipsia/polyuria or temperature intolerance Respiratory ROS: negative for - cough, hemoptysis, shortness of breath or wheezing Cardiovascular ROS: negative for - chest pain, dyspnea on exertion, edema or irregular heartbeat Gastrointestinal ROS: negative for - abdominal pain, diarrhea, hematemesis, nausea/vomiting or stool  incontinence Genito-Urinary ROS: negative for - dysuria, hematuria, incontinence or urinary frequency/urgency Musculoskeletal ROS: negative for - joint swelling Neurological ROS: as noted in HPI Dermatological ROS: negative for rash and skin lesion changes   Physical exam:  Constitutional: well developed, pleasant female in no apparent distress. Blood pressure 109/78, pulse 88, temperature 98.2 F (36.8 C), temperature source Oral, resp. rate 16, SpO2 93 %. Eyes: no jaundice or exophthalmos.  Head: normocephalic. Neck: supple, no bruits, no JVD. Cardiac: no murmurs. Lungs: clear. Abdomen: soft, no tender, no mass. Extremities: no edema, clubbing, or cyanosis.  Skin: no rash  Neurologic Examination:  General: NAD Mental Status: Alert, oriented, thought content appropriate.  Speech fluent without evidence of aphasia.  Able to follow 3 step commands without difficulty. Cranial Nerves: II: Discs flat bilaterally; Visual fields grossly normal, pupils equal, round, reactive to light and accommodation III,IV, VI: ptosis not present, extra-ocular motions intact bilaterally V,VII: smile symmetric, facial light touch sensation normal bilaterally VIII: hearing normal bilaterally IX,X: uvula rises symmetrically XI: bilateral shoulder shrug XII: midline tongue extension without atrophy or fasciculations  Motor: Patient is very sedated s/p ativan, but seems to move arms and legs symmetrically, perhaps mild weakness right arm that I can not confirm as she is heavily sedated Tone and bulk:normal tone throughout; no atrophy noted Sensory: Pinprick and light touch intact throughout, bilaterally Deep Tendon Reflexes:  Right: Upper Extremity   Left: Upper extremity   biceps (C-5 to C-6) 2/4   biceps (C-5 to C-6) 2/4 tricep (C7) 2/4    triceps (C7) 2/4 Brachioradialis (C6) 2/4  Brachioradialis (C6)  2/4  Lower Extremity Lower Extremity  quadriceps (L-2 to L-4) 2/4   quadriceps (L-2 to L-4) 2/4 Achilles (S1) 2/4   Achilles (S1) 2/4 Plantars: Right: downgoing   Left: downgoing Cerebellar: Unable to test due to heavy sedation Gait:  No tested due to multiple leads    No results found for: CHOL  Results for orders placed or performed during the hospital encounter of 10/25/15 (from the past 48 hour(s))  CBC with Differential     Status: Abnormal   Collection Time: 10/25/15  8:15 PM  Result Value Ref Range   WBC 9.9 4.0 - 10.5 K/uL   RBC 4.00 3.87 - 5.11 MIL/uL   Hemoglobin 12.3 12.0 - 15.0 g/dL   HCT 37.5 36.0 - 46.0 %   MCV 93.8 78.0 - 100.0 fL   MCH 30.8 26.0 - 34.0 pg   MCHC 32.8 30.0 - 36.0 g/dL   RDW 14.3 11.5 - 15.5 %   Platelets 172 150 - 400 K/uL   Neutrophils Relative % 79 %   Neutro Abs 7.8 (H) 1.7 - 7.7 K/uL   Lymphocytes Relative 14 %   Lymphs Abs 1.4 0.7 - 4.0 K/uL   Monocytes Relative 6 %   Monocytes Absolute 0.6 0.1 - 1.0 K/uL   Eosinophils Relative 1 %   Eosinophils Absolute 0.1 0.0 - 0.7 K/uL   Basophils Relative 0 %   Basophils Absolute 0.0 0.0 - 0.1 K/uL  Basic metabolic panel     Status: Abnormal   Collection Time: 10/25/15  8:15 PM  Result Value Ref Range   Sodium 135 135 - 145 mmol/L   Potassium 3.3 (L) 3.5 - 5.1 mmol/L   Chloride 103 101 - 111 mmol/L   CO2 23 22 - 32 mmol/L   Glucose, Bld 104 (H) 65 - 99 mg/dL   BUN 6 6 - 20 mg/dL   Creatinine, Ser 0.78 0.44 - 1.00 mg/dL   Calcium 8.6 (L) 8.9 - 10.3 mg/dL   GFR calc non Af Amer >60 >60 mL/min   GFR calc Af Amer >60 >60 mL/min    Comment: (NOTE) The eGFR has been calculated using the CKD EPI equation. This calculation has not been validated in all clinical situations. eGFR's persistently <60 mL/min signify possible Chronic Kidney Disease.    Anion gap 9 5 - 15    Ct Head Wo Contrast  10/25/2015  CLINICAL DATA:  RIGHT-sided facial swelling, stroke-like symptoms with RIGHT upper  extremity weakness for 2 days. History  of diabetes, hypertension and stroke. EXAM: CT HEAD WITHOUT CONTRAST CT MAXILLOFACIAL WITH CONTRAST TECHNIQUE: Multidetector CT imaging of the head was performed using the standard protocol without IV contrast. CT imaging of the maxillofacial structures was performed with intravenous contrast. Multiplanar CT image reconstructions were also generated. CONTRAST:  65m OMNIPAQUE IOHEXOL 300 MG/ML  SOLN COMPARISON:  CT head June 29, 2015 MRI of the brain June 29, 2015 FINDINGS: CT HEAD FINDINGS No intraparenchymal hemorrhage, mass effect, midline shift or acute large vascular territory infarct. 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 Ventriculoperitoneal shunt via a RIGHT parietal burr hole, with distal tip terminating in LEFT frontal horn, unchanged. Mild to moderate ventriculomegaly, decreased from prior imaging, no CT findings of hydrocephalus. No abnormal extra-axial fluid collections. Basal cisterns are patent. No skull fracture. CT MAXILLOFACIAL FINDINGS RIGHT facial/ preauricular mild soft tissue swelling, reticulated fat with minimal inflammatory changes superficial lobe of the RIGHT parotid gland. No parotid masses, ductal dilatation or sialolith. No radiopaque foreign bodies. Ocular globes and orbital contents are normal. Paranasal sinuses and mastoid air cells are well aerated. RIGHT jugular bulb dehiscence. No destructive bony lesions. Included airway is patent. Ventriculoperitoneal shunt courses through RIGHT neck, visualized portion is contiguous. Possible cerebellar DVA use though, incompletely characterized. IMPRESSION: CT HEAD: No acute intracranial process. Slightly decreased ventriculomegaly, stable positioning of ventriculoperitoneal shunt. 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 CT MAXILLOFACIAL: Mild RIGHT facial/preauricular soft tissue swelling most compatible cellulitis without drainable fluid collection. Mild inflammation of the parotid gland is likely  reactive, less likely parotiditis. Electronically Signed   By: CElon AlasM.D.   On: 10/25/2015 22:35   Mr Brain Wo Contrast  10/26/2015  CLINICAL DATA:  New onset RIGHT-sided weakness. History of ventriculoperitoneal shunt and stroke 4 months ago. History of diabetes, hypertension. EXAM: MRI HEAD WITHOUT CONTRAST TECHNIQUE: Multiplanar, multiecho pulse sequences of the brain and surrounding structures were obtained without intravenous contrast. COMPARISON:  CT head October 25, 2015 at 2133 hours and MRI of the brain June 30th 2016 and MRI of the brain July 03, 2010 FINDINGS: No reduced diffusion to suggest acute ischemia. No susceptibility artifact is suggests hemorrhage. Ventriculoperitoneal shunt via RIGHT parietal approach with distal tip in the frontal horn of LEFT lateral ventricle. Mild gliosis along the catheter tract, unchanged. Slightly decreased ventricle size from prior imaging, no hydrocephalus. Cystic RIGHT thalamic lacunar infarct which was acute on prior MRI. Minimal nonspecific white matter T2 hyperintensities. No midline shift, mass effect or mass lesions. Chronic trace holohemispheric subdural hematomas with intermediate signal. No new extra-axial fluid collection. Normal major intracranial vascular flow voids seen at the skull base. Included ocular globes and orbital contents are unremarkable. Paranasal sinuses and mastoid air cells are well aerated. No abnormal sellar expansion. No suspicious calvarial bone marrow signal. IMPRESSION: No acute intracranial process, specifically no acute ischemia. 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 RIGHT ventriculoperitoneal shunt in place, no hydrocephalus. 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 Electronically Signed   By: CElon AlasM.D.   On: 10/26/2015 00:27   Ct Maxillofacial W/cm  10/25/2015  CLINICAL DATA:  RIGHT-sided facial swelling, stroke-like symptoms with RIGHT upper extremity weakness for 2 days. History of  diabetes, hypertension and stroke. EXAM: CT HEAD WITHOUT CONTRAST CT MAXILLOFACIAL WITH CONTRAST TECHNIQUE: Multidetector CT imaging of the head was performed using the standard protocol without IV contrast. CT imaging of the maxillofacial structures was performed with intravenous contrast. Multiplanar CT image reconstructions were also generated. CONTRAST:  756mOMNIPAQUE IOHEXOL 300 MG/ML  SOLN COMPARISON:  CT head June 29, 2015 MRI of the brain June 29, 2015 FINDINGS: CT HEAD FINDINGS No intraparenchymal hemorrhage, mass effect, midline shift or acute large vascular territory infarct. 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 Ventriculoperitoneal shunt via a RIGHT parietal burr hole, with distal tip terminating in LEFT frontal horn, unchanged. Mild to moderate ventriculomegaly, decreased from prior imaging, no CT findings of hydrocephalus. No abnormal extra-axial fluid collections. Basal cisterns are patent. No skull fracture. CT MAXILLOFACIAL FINDINGS RIGHT facial/ preauricular mild soft tissue swelling, reticulated fat with minimal inflammatory changes superficial lobe of the RIGHT parotid gland. No parotid masses, ductal dilatation or sialolith. No radiopaque foreign bodies. Ocular globes and orbital contents are normal. Paranasal sinuses and mastoid air cells are well aerated. RIGHT jugular bulb dehiscence. No destructive bony lesions. Included airway is patent. Ventriculoperitoneal shunt courses through RIGHT neck, visualized portion is contiguous. Possible cerebellar DVA use though, incompletely characterized. IMPRESSION: CT HEAD: No acute intracranial process. Slightly decreased ventriculomegaly, stable positioning of ventriculoperitoneal shunt. 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 CT MAXILLOFACIAL: Mild RIGHT facial/preauricular soft tissue swelling most compatible cellulitis without drainable fluid collection. Mild inflammation of the parotid gland is likely reactive, less likely parotiditis.  Electronically Signed   By: Elon Alas M.D.   On: 10/25/2015 22:35   Dg Chest Portable 1 View  10/25/2015  CLINICAL DATA:  Swelling on the right side of the face for 3 days. VP shunt placed in 2000. History of stroke and left-sided weakness. EXAM: PORTABLE CHEST 1 VIEW COMPARISON:  Chest radiograph 07/02/2010 FINDINGS: Prominent interstitial lung markings bilaterally with mild peribronchial thickening. Mild fullness in the perihilar regions. Heart size is within normal limits. The trachea is midline. There is a right VP shunt. IMPRESSION: Prominent interstitial lung markings with perihilar fullness. Findings are concerning for mild pulmonary edema. Electronically Signed   By: Markus Daft M.D.   On: 10/25/2015 21:28   Assessment/Plan: 39 y/o admitted with diagnosis of right facial cellulitis, and new onset right UE weakness. MRI brain without acute or chronic abnormality that could explain her weakness. Patient is heavily sedated at this time and the neuro-exam compromised by sedation. A cervical cord process seems unlikely. Will reassess patient when she is less sedated and determine whether or not MRI cervical spine is warranted. Will follow up.  Dorian Pod, MD 10/26/2015, 12:43 AM  [facility]

## 2015-10-26 NOTE — Discharge Summary (Signed)
Physician Discharge Summary  Kelli Miranda ZOX:[id number] DOB: 06-18-1976 DOA: 10/25/2015  PCP: PROVIDER NOT IN SYSTEM  Admit date: 10/25/2015 Discharge date: 10/27/2015 Recommendations for Outpatient Follow-up:  1. Pt will need to follow up with PCP in 2 weeks post discharge 2. Please obtain bmp in one week   Discharge Diagnoses:  Right upper extremity weakness/sensory disturbance -10/26/2015 MRI brain negative for acute infarct but shows trace chronic bilateral holocephalic SDH -PT-->HHPT -appreciate neurology consult -10/26/2015 MRI brain negative for acute infarct -MR cervical spine--neg for cord lesion or impingement -LDL 90 -continue ASA -check B12--242-->may be contributing to her dysesthesias and possible weakness-->start B12 supplementation after d/c--554mcg po daily -pt given B12--1079mcg IM x 1 prior to d/c -HIV, RPR--pending -TSH --0.987 -RBC folate--pending at time of d/c -10/26/2015 echo EF 55-60%, no WMA -pt was not on anti-platelet prior to admission, instructed pt to take ASA 81 mg daily -clinically, pt states that her R-sided weakness and dysesthesias are improving but not completely resolved Facial cellulitis -improving on abx -?parotitis--minimal tenderness on exam -The patient will go home on oral clindamycin 300 mg every 6 hours for 8 additional days to complete his therapy Hyperglycemia -HbA1C--5.4 Depression/Anxiety -continue fluoxetine, gabapentin, seroquel, topamax, trazadone COPD -stable on RA -continue LABA Tobacco abuse -Tobacco cessation discussed -Patient declined NicoDerm patch Hx of stroke -continue ASA Hypokalemia -Replete -Check magnesium  Discharge Condition: stable  Disposition: home  Diet:heart healthy Wt Readings from Last 3 Encounters:  10/26/15 76.9 kg (169 lb 8.5 oz)    History of present illness:  39 year old female with a history of right thalamic lacunar infarct, diabetes mellitus, COPD, tobacco abuse,  hypertension, VP shunt presents with 2-3 day history of right upper extremity, right lower extremity weakness with associated dysesthesias. After admission, the patient states that she still has some right upper extremity or lower extremity weakness which has somewhat improved but still has numbness and tingling. The patient denies any recent injury or trauma. MRI of the brain was obtained and revealed an old right thalamic lacunar infarct without acute findings. There was trace chronic bilateral SDH. Patient states that she recently finished a course of prednisone, antibiotics for presumed bronchitis. She finished approximately 3 days prior to the admission.  Neurology was consulted and ordered MRI of cervical spine Additional lab work for peripheral neuropathy including HIV, RPR, serum B12, RBC folate, TSH were unremarkable.  PT eval was ordered and they recommended home health PT which was set up with assistance of care management.  Consultants: Neurology  Discharge Exam: Filed Vitals:   10/27/15 0958  BP: 100/71  Pulse: 72  Temp: 97.6 F (36.4 C)  Resp: 16   Filed Vitals:   10/26/15 2206 10/27/15 0111 10/27/15 0627 10/27/15 0958  BP: 107/89 100/62 105/58 100/71  Pulse: 82 70 73 72  Temp: 97.9 F (36.6 C) 98 F (36.7 C) 98.7 F (37.1 C) 97.6 F (36.4 C)  TempSrc: Oral Oral Oral Axillary  Resp: 16 18 18 16   Weight:      SpO2: 97% 99% 100% 95%   General: A&O x 3, NAD, pleasant, cooperative Cardiovascular: RRR, no rub, no gallop, no S3 Respiratory: CTAB, no wheeze, no rhonchi Abdomen:soft, nontender, nondistended, positive bowel sounds Extremities: No edema, No lymphangitis, no petechiae  Discharge Instructions     Medication List    TAKE these medications        ALPRAZolam 0.5 MG tablet  Commonly known as:  XANAX  Take 0.5 mg by mouth at bedtime as needed  for anxiety.     aspirin EC 81 MG tablet  Take 1 tablet (81 mg total) by mouth daily.     clindamycin 300 MG  capsule  Commonly known as:  CLEOCIN  Take 1 capsule (300 mg total) by mouth 4 (four) times daily.     cyanocobalamin 500 MCG tablet  Take 1 tablet (500 mcg total) by mouth daily.     diclofenac sodium 1 % Gel  Commonly known as:  VOLTAREN  Apply 2 g topically daily as needed. For knee pain     FLUoxetine 20 MG tablet  Commonly known as:  PROZAC  Take 60 mg by mouth daily.     Fluticasone-Salmeterol 250-50 MCG/DOSE Aepb  Commonly known as:  ADVAIR  Inhale 1 puff into the lungs 2 (two) times daily as needed. For shortness of breath     gabapentin 300 MG capsule  Commonly known as:  NEURONTIN  Take 300 mg by mouth 3 (three) times daily.     HYDROcodone-acetaminophen 10-325 MG tablet  Commonly known as:  NORCO  Take 1 tablet by mouth every 6 (six) hours as needed for moderate pain.     levETIRAcetam 250 MG tablet  Commonly known as:  KEPPRA  Take 250 mg by mouth 2 (two) times daily.     ondansetron 4 MG disintegrating tablet  Commonly known as:  ZOFRAN-ODT  Take 4 mg by mouth 2 (two) times daily. Take every day per patient and family members     pregabalin 50 MG capsule  Commonly known as:  LYRICA  Take 150 mg by mouth 3 (three) times daily.     promethazine 25 MG tablet  Commonly known as:  PHENERGAN  Take 25 mg by mouth every 6 (six) hours as needed for nausea or vomiting.     QUEtiapine 100 MG tablet  Commonly known as:  SEROQUEL  Take 100 mg by mouth at bedtime.     topiramate 200 MG tablet  Commonly known as:  TOPAMAX  Take 400 mg by mouth 2 (two) times daily.     traZODone 100 MG tablet  Commonly known as:  DESYREL  Take 300 mg by mouth at bedtime.         The results of significant diagnostics from this hospitalization (including imaging, microbiology, ancillary and laboratory) are listed below for reference.    Significant Diagnostic Studies: Ct Head Wo Contrast  10/25/2015  CLINICAL DATA:  RIGHT-sided facial swelling, stroke-like symptoms with  RIGHT upper extremity weakness for 2 days. History of diabetes, hypertension and stroke. EXAM: CT HEAD WITHOUT CONTRAST CT MAXILLOFACIAL WITH CONTRAST TECHNIQUE: Multidetector CT imaging of the head was performed using the standard protocol without IV contrast. CT imaging of the maxillofacial structures was performed with intravenous contrast. Multiplanar CT image reconstructions were also generated. CONTRAST:  75mL OMNIPAQUE IOHEXOL 300 MG/ML  SOLN COMPARISON:  CT head June 29, 2015 MRI of the brain June 29, 2015 FINDINGS: CT HEAD FINDINGS No intraparenchymal hemorrhage, mass effect, midline shift or acute large vascular territory infarct. 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 Ventriculoperitoneal shunt via a RIGHT parietal burr hole, with distal tip terminating in LEFT frontal horn, unchanged. Mild to moderate ventriculomegaly, decreased from prior imaging, no CT findings of hydrocephalus. No abnormal extra-axial fluid collections. Basal cisterns are patent. No skull fracture. CT MAXILLOFACIAL FINDINGS RIGHT facial/ preauricular mild soft tissue swelling, reticulated fat with minimal inflammatory changes superficial lobe of the RIGHT parotid gland. No parotid masses, ductal dilatation or  sialolith. No radiopaque foreign bodies. Ocular globes and orbital contents are normal. Paranasal sinuses and mastoid air cells are well aerated. RIGHT jugular bulb dehiscence. No destructive bony lesions. Included airway is patent. Ventriculoperitoneal shunt courses through RIGHT neck, visualized portion is contiguous. Possible cerebellar DVA use though, incompletely characterized. IMPRESSION: CT HEAD: No acute intracranial process. Slightly decreased ventriculomegaly, stable positioning of ventriculoperitoneal shunt. 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 CT MAXILLOFACIAL: Mild RIGHT facial/preauricular soft tissue swelling most compatible cellulitis without drainable fluid collection. Mild inflammation of the parotid gland is  likely reactive, less likely parotiditis. Electronically Signed   By: Awilda Metroourtnay  Bloomer M.D.   On: 10/25/2015 22:35   Mr Brain Wo Contrast  10/26/2015  CLINICAL DATA:  New onset RIGHT-sided weakness. History of ventriculoperitoneal shunt and stroke 4 months ago. History of diabetes, hypertension. EXAM: MRI HEAD WITHOUT CONTRAST TECHNIQUE: Multiplanar, multiecho pulse sequences of the brain and surrounding structures were obtained without intravenous contrast. COMPARISON:  CT head October 25, 2015 at 2133 hours and MRI of the brain June 30th 2016 and MRI of the brain July 03, 2010 FINDINGS: No reduced diffusion to suggest acute ischemia. No susceptibility artifact is suggests hemorrhage. Ventriculoperitoneal shunt via RIGHT parietal approach with distal tip in the frontal horn of LEFT lateral ventricle. Mild gliosis along the catheter tract, unchanged. Slightly decreased ventricle size from prior imaging, no hydrocephalus. Cystic RIGHT thalamic lacunar infarct which was acute on prior MRI. Minimal nonspecific white matter T2 hyperintensities. No midline shift, mass effect or mass lesions. Chronic trace holohemispheric subdural hematomas with intermediate signal. No new extra-axial fluid collection. Normal major intracranial vascular flow voids seen at the skull base. Included ocular globes and orbital contents are unremarkable. Paranasal sinuses and mastoid air cells are well aerated. No abnormal sellar expansion. No suspicious calvarial bone marrow signal. IMPRESSION: No acute intracranial process, specifically no acute ischemia. 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 RIGHT ventriculoperitoneal shunt in place, no hydrocephalus. 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 Electronically Signed   By: Awilda Metroourtnay  Bloomer M.D.   On: 10/26/2015 00:27   Mr Cervical Spine Wo Contrast  10/26/2015  CLINICAL DATA:  Left-sided numbness. EXAM: MRI CERVICAL SPINE WITHOUT CONTRAST TECHNIQUE: Multiplanar,  multisequence MR imaging of the cervical spine was performed. No intravenous contrast was administered. COMPARISON:  07/03/2010 FINDINGS: No marrow signal abnormality suggestive of fracture, infection, or neoplasm. Chronic calvarial thickening and dural thickening in the posterior fossa. Related engorged appearance of the cervical venous plexus, giving an effaced appearance to the thecal sac and prominent cord size. When compared to prior, there is no indication of cord swelling and cord signal is normal. 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 This is stable since at least 2010. No notable degenerative change in the cervical spine. No stenosis or impingement. IMPRESSION: 1. No impingement or cord lesion to explain sensory deficit. 2. Chronic calvarial and dural thickening, presumably shunt related. Electronically Signed   By: Marnee SpringJonathon  Watts M.D.   On: 10/26/2015 18:31   Ct Maxillofacial W/cm  10/25/2015  CLINICAL DATA:  RIGHT-sided facial swelling, stroke-like symptoms with RIGHT upper extremity weakness for 2 days. History of diabetes, hypertension and stroke. EXAM: CT HEAD WITHOUT CONTRAST CT MAXILLOFACIAL WITH CONTRAST TECHNIQUE: Multidetector CT imaging of the head was performed using the standard protocol without IV contrast. CT imaging of the maxillofacial structures was performed with intravenous contrast. Multiplanar CT image reconstructions were also generated. CONTRAST:  75mL OMNIPAQUE IOHEXOL 300 MG/ML  SOLN COMPARISON:  CT head June 29, 2015 MRI of the brain June 29, 2015 FINDINGS: CT HEAD FINDINGS No intraparenchymal hemorrhage, mass effect, midline shift or acute large vascular territory infarct. 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 Ventriculoperitoneal shunt via a RIGHT parietal burr hole, with distal tip terminating in LEFT frontal horn, unchanged. Mild to moderate  ventriculomegaly, decreased from prior imaging, no CT findings of hydrocephalus. No abnormal extra-axial fluid collections. Basal cisterns are patent. No skull fracture. CT MAXILLOFACIAL FINDINGS RIGHT facial/ preauricular mild soft tissue swelling, reticulated fat with minimal inflammatory changes superficial lobe of the RIGHT parotid gland. No parotid masses, ductal dilatation or sialolith. No radiopaque foreign bodies. Ocular globes and orbital contents are normal. Paranasal sinuses and mastoid air cells are well aerated. RIGHT jugular bulb dehiscence. No destructive bony lesions. Included airway is patent. Ventriculoperitoneal shunt courses through RIGHT neck, visualized portion is contiguous. Possible cerebellar DVA use though, incompletely characterized. IMPRESSION: CT HEAD: No acute intracranial process. Slightly decreased ventriculomegaly, stable positioning of ventriculoperitoneal shunt. 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 CT MAXILLOFACIAL: Mild RIGHT facial/preauricular soft tissue swelling most compatible cellulitis without drainable fluid collection. Mild inflammation of the parotid gland is likely reactive, less likely parotiditis. Electronically Signed   By: Awilda Metro M.D.   On: 10/25/2015 22:35   Dg Chest Portable 1 View  10/25/2015  CLINICAL DATA:  Swelling on the right side of the face for 3 days. VP shunt placed in 2000. History of stroke and left-sided weakness. EXAM: PORTABLE CHEST 1 VIEW COMPARISON:  Chest radiograph 07/02/2010 FINDINGS: Prominent interstitial lung markings bilaterally with mild peribronchial thickening. Mild fullness in the perihilar regions. Heart size is within normal limits. The trachea is midline. There is a right VP shunt. IMPRESSION: Prominent interstitial lung markings with perihilar fullness. Findings are concerning for mild pulmonary edema. Electronically Signed   By: Richarda Overlie M.D.   On: 10/25/2015 21:28     Microbiology: No results found for this or  any previous visit (from the past 240 hour(s)).   Labs: Basic Metabolic Panel:  Recent Labs Lab 10/25/15 2015 10/27/15 0244  NA 135 140  K 3.3* 3.7  CL 103 106  CO2 23 24  GLUCOSE 104* 91  BUN 6 7  CREATININE 0.78 0.96  CALCIUM 8.6* 8.7*   Liver Function Tests: No results for input(s): AST, ALT, ALKPHOS, BILITOT, PROT, ALBUMIN in the last 168 hours. No results for input(s): LIPASE, AMYLASE in the last 168 hours. No results for input(s): AMMONIA in the last 168 hours. CBC:  Recent Labs Lab 10/25/15 2015 10/27/15 0244  WBC 9.9 4.3  NEUTROABS 7.8*  --   HGB 12.3 11.0*  HCT 37.5 34.1*  MCV 93.8 93.2  PLT 172 186   Cardiac Enzymes: No results for input(s): CKTOTAL, CKMB, CKMBINDEX, TROPONINI in the last 168 hours. BNP: Invalid input(s): POCBNP CBG:  Recent Labs Lab 10/26/15 0719 10/26/15 1134 10/26/15 1618  GLUCAP 88 115* 89    Time coordinating discharge:  Greater than 30 minutes  Signed:  Javeah Loeza, DO [facility] Pager: [phone] 10/27/2015, 10:35 AM

## 2015-10-27 DIAGNOSIS — R739 Hyperglycemia, unspecified: Secondary | ICD-10-CM

## 2015-10-27 DIAGNOSIS — E876 Hypokalemia: Secondary | ICD-10-CM

## 2015-10-27 DIAGNOSIS — R208 Other disturbances of skin sensation: Secondary | ICD-10-CM

## 2015-10-27 LAB — BASIC METABOLIC PANEL
ANION GAP: 10 (ref 5–15)
BUN: 7 mg/dL (ref 6–20)
CALCIUM: 8.7 mg/dL — AB (ref 8.9–10.3)
CO2: 24 mmol/L (ref 22–32)
Chloride: 106 mmol/L (ref 101–111)
Creatinine, Ser: 0.96 mg/dL (ref 0.44–1.00)
GFR calc Af Amer: >60 mL/min (ref >60)
GFR calc non Af Amer: >60 mL/min (ref >60)
Glucose, Bld: 91 mg/dL (ref 65–99)
Potassium: 3.7 mmol/L (ref 3.5–5.1)
Sodium: 140 mmol/L (ref 135–145)

## 2015-10-27 LAB — CBC
HCT: 34.1 % — ABNORMAL LOW (ref 36.0–46.0)
HEMOGLOBIN: 11 g/dL — AB (ref 12.0–15.0)
MCH: 30.1 pg (ref 26.0–34.0)
MCHC: 32.3 g/dL (ref 30.0–36.0)
MCV: 93.2 fL (ref 78.0–100.0)
Platelets: 186 10*3/uL (ref 150–400)
RBC: 3.66 MIL/uL — AB (ref 3.87–5.11)
RDW: 14.4 % (ref 11.5–15.5)
WBC: 4.3 10*3/uL (ref 4.0–10.5)

## 2015-10-27 LAB — VITAMIN B12: Vitamin B-12: 242 pg/mL (ref 180–914)

## 2015-10-27 LAB — HEMOGLOBIN A1C
Hgb A1c MFr Bld: 5.4 % (ref 4.8–5.6)
MEAN PLASMA GLUCOSE: 108 mg/dL

## 2015-10-27 LAB — RPR: RPR: NONREACTIVE

## 2015-10-27 LAB — TSH: TSH: 0.987 u[IU]/mL (ref 0.350–4.500)

## 2015-10-27 LAB — HIV ANTIBODY (ROUTINE TESTING W REFLEX): HIV SCREEN 4TH GENERATION: NONREACTIVE

## 2015-10-27 MED ORDER — CYANOCOBALAMIN 500 MCG PO TABS: 500.0000 ug | ORAL_TABLET | Freq: Every day | ORAL | Status: DC

## 2015-10-27 MED ORDER — CLINDAMYCIN HCL 300 MG PO CAPS: 300.0000 mg | ORAL_CAPSULE | Freq: Four times a day (QID) | ORAL | Status: DC

## 2015-10-27 MED ORDER — CYANOCOBALAMIN 1000 MCG/ML IJ SOLN
1000.0000 ug | Freq: Once | INTRAMUSCULAR | Status: AC
  Administered 2015-10-27: 1000 ug via INTRAMUSCULAR
  Filled 2015-10-27: qty 1

## 2015-10-27 MED ORDER — ASPIRIN EC 81 MG PO TBEC: 81.0000 mg | DELAYED_RELEASE_TABLET | Freq: Every day | ORAL | Status: DC

## 2015-10-27 NOTE — Progress Notes (Signed)
Upon entering pts room, pt was not there but her belongings were.  Security and Press photographercharge nurse were notified.  45 minutes later pt returned to unit.  She stated that she went with her aunt (who was present when she returned) to see her uncle on [address].  Pt and her aunt both stated they notified RT that pt was leaving but nursing staff was never notified.  Pt was educated on the importance of remaining in her room and not leaving unattended.

## 2015-10-27 NOTE — Care Management Note (Addendum)
Case Management Note  Patient Details  Name: Kelli Miranda MRN: [id number] Date of Birth: 12-04-1976  Subjective/Objective:                    Action/Plan: Patient admitted to r/o CVA and had a negative MRI. Patient also has cellulitis of her face and an enlarged parotid gland. She lives at home with family. Patient being discharged home today with an order for home health PT. CM met with the patient and provided her a list of home health agencies in the [facility] area. Patient already knew she wanted [facility]. CM spoke with Sheppard Evens at [facility] and she accepted the referral. No PCP on file, but patient states she goes to Dr. Lillette Boxer [phone]. Bedside RN updated.   Expected Discharge Date:                  Expected Discharge Plan:  [place]  In-House Referral:     Discharge planning Services  CM Consult  Post Acute Care Choice:    Choice offered to:  Patient  DME Arranged:    DME Agency:     HH Arranged:  PT [place]:  [place]  Status of Service:  Completed, signed off  Medicare Important Message Given:    Date Medicare IM Given:    Medicare IM give by:    Date Additional Medicare IM Given:    Additional Medicare Important Message give by:     If discussed at [place] of Stay Meetings, dates discussed:    Additional Comments:  Pollie Friar, RN 10/27/2015, 10:13 AM

## 2015-10-27 NOTE — Evaluation (Signed)
Speech Language Pathology Evaluation Patient Details Name: Kelli Miranda MRN: [id number] DOB: 1976-12-04 Today's Date: 10/27/2015 Time: 1100-1120 SLP Time Calculation (min) (ACUTE ONLY): 20 min  Problem List:  Patient Active Problem List   Diagnosis Date Noted  . Sensory disturbance 10/26/2015  . Hyperglycemia 10/26/2015  . Hypokalemia 10/26/2015  . Facial cellulitis   . Cerebral infarction due to unspecified mechanism   . 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 Left-sided weakness 10/25/2015   Past Medical History:  Past Medical History  Diagnosis Date  . Stroke (HCC)   . Diabetes mellitus without complication (HCC)   . Hypertension   . COPD (chronic obstructive pulmonary disease) (HCC)    Past Surgical History:  Past Surgical History  Procedure Laterality Date  . Knee surgery    . Ventriculoperitoneal shunt     HPI:  39 y.o. female with a past medical history significant for HTN, DM, heavy smoker, ischemic right thalamic stroke approximately 4 months ago without residual mild left sided weakness, deficit, ventriculoperitoneal shunt, and COPD, sent to the ED from her PCP office for evaluation of right facial swelling. In ED pt also reported RUE weakness and numbness MRI head negative   Assessment / Plan / Recommendation Clinical Impression  Patient presents with functional cognitive-linguistic abilities with self-reported h/o of speech and cognitive impairments.Patient appears to be functioning at baseline for cognitive-linguistic abilities. Although she does present with a mild dysarthria, patient reported h/o of speech not being clear. Patient is also edentulous and lack of dentures also likely impacts her speech intelligibility. Patient exhibted right facial swelling, with patient report of decreased sensation on right side of face. This is secondary to diagnosis of enlarged right parotid gland. Patient's overall intelligibility at sentence and conversational level is  95-100%.    SLP Assessment  Patient does not need any further Speech Lanaguage Pathology Services    Follow Up Recommendations  None    Frequency and Duration        Pertinent Vitals/Pain Pain Assessment: 0-10 Pain Score: 7  Pain Location: right leg Pain Descriptors / Indicators: Aching;Sharp Pain Intervention(s): Other (comment) (patient c/o of sudden onset pain in right leg, which went away without intervention. Patient stated: "My pain meds are at home...the nurse only gives me tylenol")   SLP Goals  Patient/Family Stated Goal: "My daughter's coming to get me"  SLP Evaluation Prior Functioning  Cognitive/Linguistic Baseline: Information not available Type of Home: House Available Help at Discharge: Family;Personal care attendant Vocation: On disability   Cognition  Overall Cognitive Status: History of cognitive impairments - at baseline Orientation Level: Oriented X4 Memory: Appears intact Awareness: Appears intact Problem Solving: Appears intact    Comprehension  Auditory Comprehension Overall Auditory Comprehension: Appears within functional limits for tasks assessed    Expression Expression Primary Mode of Expression: Verbal Verbal Expression Overall Verbal Expression: Appears within functional limits for tasks assessed   Oral / Motor Oral Motor/Sensory Function Overall Oral Motor/Sensory Function: Impaired (patient with enlarged parotid gland which is likely causing her oral motor and sensory issues) Labial ROM: Reduced right Labial Sensation: Reduced Facial ROM: Reduced right Facial Sensation: Reduced Motor Speech Overall Motor Speech: Impaired at baseline (Patient is edentulous and reported h/o speech problems) Phonation: Normal Intelligibility: Intelligibility reduced Word: 75-100% accurate Phrase: 75-100% accurate Sentence: 75-100% accurate Conversation: 75-100% accurate Motor Planning: Witnin functional limits Motor Speech Errors: Not applicable    GO    Angela NevinJohn T. Preston, MA, CCC-SLP 10/27/2015 2:28 PM

## 2015-10-27 NOTE — Progress Notes (Signed)
Subjective: Patient awake and alert.  Wants to go home.  Reports continued right paresthesias.    Objective: Current vital signs: BP 105/58 mmHg  Pulse 73  Temp(Src) 98.7 F (37.1 C) (Oral)  Resp 18  Wt 76.9 kg (169 lb 8.5 oz)  SpO2 100% Vital signs in last 24 hours: Temp:  [97.8 F (36.6 C)-98.7 F (37.1 C)] 98.7 F (37.1 C) (10/28 0627) Pulse Rate:  [70-84] 73 (10/28 0627) Resp:  [16-18] 18 (10/28 0627) BP: (100-110)/(58-89) 105/58 mmHg (10/28 0627) SpO2:  [94 %-100 %] 100 % (10/28 0627)  Intake/Output from previous day:   Intake/Output this shift:   Nutritional status: Diet heart healthy/carb modified Room service appropriate?: Yes; Fluid consistency:: Thin  Neurologic Exam: Mental Status: Lethargic, oriented, thought content appropriate. Speech fluent without evidence of aphasia. Able to follow 3 step commands without difficulty. Cranial Nerves: II: Discs flat bilaterally; Visual fields grossly normal, pupils equal, round, reactive to light and accommodation III,IV, VI: ptosis not present, extra-ocular motions intact bilaterally V,VII: smile symmetric, facial light touch sensation normal bilaterally VIII: hearing normal bilaterally IX,X: uvula rises symmetrically XI: bilateral shoulder shrug XII: midline tongue extension without atrophy or fasciculations Motor: Uses both upper extremities with no focal weakness noted with spontaneous movement but when tested formally gives 3/5 right hand grip. Lifts each leg with 3/5 strength.  Sensory: Pinprick and light touch decreased on the right   Lab Results: Basic Metabolic Panel:  Recent Labs Lab 10/25/15 2015 10/27/15 0244  NA 135 140  K 3.3* 3.7  CL 103 106  CO2 23 24  GLUCOSE 104* 91  BUN 6 7  CREATININE 0.78 0.96  CALCIUM 8.6* 8.7*    Liver Function Tests: No results for input(s): AST, ALT, ALKPHOS, BILITOT, PROT, ALBUMIN in the last 168 hours. No results for input(s): LIPASE, AMYLASE in the last 168  hours. No results for input(s): AMMONIA in the last 168 hours.  CBC:  Recent Labs Lab 10/25/15 2015 10/27/15 0244  WBC 9.9 4.3  NEUTROABS 7.8*  --   HGB 12.3 11.0*  HCT 37.5 34.1*  MCV 93.8 93.2  PLT 172 186    Cardiac Enzymes: No results for input(s): CKTOTAL, CKMB, CKMBINDEX, TROPONINI in the last 168 hours.  Lipid Panel:  Recent Labs Lab 10/26/15 0430  CHOL 155  TRIG 165*  HDL 32*  CHOLHDL 4.8  VLDL 33  LDLCALC 90    CBG:  Recent Labs Lab 10/26/15 0719 10/26/15 1134 10/26/15 1618  GLUCAP 88 115* 89    Microbiology: No results found for this or any previous visit.  Coagulation Studies: No results for input(s): LABPROT, INR in the last 72 hours.  Imaging: Ct Head Wo Contrast  10/25/2015  CLINICAL DATA:  RIGHT-sided facial swelling, stroke-like symptoms with RIGHT upper extremity weakness for 2 days. History of diabetes, hypertension and stroke. EXAM: CT HEAD WITHOUT CONTRAST CT MAXILLOFACIAL WITH CONTRAST TECHNIQUE: Multidetector CT imaging of the head was performed using the standard protocol without IV contrast. CT imaging of the maxillofacial structures was performed with intravenous contrast. Multiplanar CT image reconstructions were also generated. CONTRAST:  75mL OMNIPAQUE IOHEXOL 300 MG/ML  SOLN COMPARISON:  CT head June 29, 2015 MRI of the brain June 29, 2015 FINDINGS: CT HEAD FINDINGS No intraparenchymal hemorrhage, mass effect, midline shift or acute large vascular territory infarct. 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 Ventriculoperitoneal shunt via a RIGHT parietal burr hole, with distal tip terminating in LEFT frontal horn, unchanged. Mild to moderate ventriculomegaly, decreased  from prior imaging, no CT findings of hydrocephalus. No abnormal extra-axial fluid collections. Basal cisterns are patent. No skull fracture. CT MAXILLOFACIAL FINDINGS RIGHT facial/ preauricular mild soft tissue swelling, reticulated fat with minimal inflammatory changes  superficial lobe of the RIGHT parotid gland. No parotid masses, ductal dilatation or sialolith. No radiopaque foreign bodies. Ocular globes and orbital contents are normal. Paranasal sinuses and mastoid air cells are well aerated. RIGHT jugular bulb dehiscence. No destructive bony lesions. Included airway is patent. Ventriculoperitoneal shunt courses through RIGHT neck, visualized portion is contiguous. Possible cerebellar DVA use though, incompletely characterized. IMPRESSION: CT HEAD: No acute intracranial process. Slightly decreased ventriculomegaly, stable positioning of ventriculoperitoneal shunt. 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 CT MAXILLOFACIAL: Mild RIGHT facial/preauricular soft tissue swelling most compatible cellulitis without drainable fluid collection. Mild inflammation of the parotid gland is likely reactive, less likely parotiditis. Electronically Signed   By: Awilda Metroourtnay  Bloomer M.D.   On: 10/25/2015 22:35   Mr Brain Wo Contrast  10/26/2015  CLINICAL DATA:  New onset RIGHT-sided weakness. History of ventriculoperitoneal shunt and stroke 4 months ago. History of diabetes, hypertension. EXAM: MRI HEAD WITHOUT CONTRAST TECHNIQUE: Multiplanar, multiecho pulse sequences of the brain and surrounding structures were obtained without intravenous contrast. COMPARISON:  CT head October 25, 2015 at 2133 hours and MRI of the brain June 30th 2016 and MRI of the brain July 03, 2010 FINDINGS: No reduced diffusion to suggest acute ischemia. No susceptibility artifact is suggests hemorrhage. Ventriculoperitoneal shunt via RIGHT parietal approach with distal tip in the frontal horn of LEFT lateral ventricle. Mild gliosis along the catheter tract, unchanged. Slightly decreased ventricle size from prior imaging, no hydrocephalus. Cystic RIGHT thalamic lacunar infarct which was acute on prior MRI. Minimal nonspecific white matter T2 hyperintensities. No midline shift, mass effect or mass lesions. Chronic trace  holohemispheric subdural hematomas with intermediate signal. No new extra-axial fluid collection. Normal major intracranial vascular flow voids seen at the skull base. Included ocular globes and orbital contents are unremarkable. Paranasal sinuses and mastoid air cells are well aerated. No abnormal sellar expansion. No suspicious calvarial bone marrow signal. IMPRESSION: No acute intracranial process, specifically no acute ischemia. 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 RIGHT ventriculoperitoneal shunt in place, no hydrocephalus. 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 Electronically Signed   By: Awilda Metroourtnay  Bloomer M.D.   On: 10/26/2015 00:27   Mr Cervical Spine Wo Contrast  10/26/2015  CLINICAL DATA:  Left-sided numbness. EXAM: MRI CERVICAL SPINE WITHOUT CONTRAST TECHNIQUE: Multiplanar, multisequence MR imaging of the cervical spine was performed. No intravenous contrast was administered. COMPARISON:  07/03/2010 FINDINGS: No marrow signal abnormality suggestive of fracture, infection, or neoplasm. Chronic calvarial thickening and dural thickening in the posterior fossa. Related engorged appearance of the cervical venous plexus, giving an effaced appearance to the thecal sac and prominent cord size. When compared to prior, there is no indication of cord swelling and cord signal is normal. 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 This is stable since at least 2010. No notable degenerative change in the cervical spine. No stenosis or impingement. IMPRESSION: 1. No impingement or cord lesion to explain sensory deficit. 2. Chronic calvarial and dural thickening, presumably shunt related. Electronically Signed   By: Marnee SpringJonathon  Watts M.D.   On: 10/26/2015 18:31   Ct Maxillofacial W/cm  10/25/2015  CLINICAL DATA:  RIGHT-sided facial swelling, stroke-like symptoms with RIGHT upper extremity  weakness for 2 days.  History of diabetes, hypertension and stroke. EXAM: CT HEAD WITHOUT CONTRAST CT MAXILLOFACIAL WITH CONTRAST TECHNIQUE: Multidetector CT imaging of the head was performed using the standard protocol without IV contrast. CT imaging of the maxillofacial structures was performed with intravenous contrast. Multiplanar CT image reconstructions were also generated. CONTRAST:  75mL OMNIPAQUE IOHEXOL 300 MG/ML  SOLN COMPARISON:  CT head June 29, 2015 MRI of the brain June 29, 2015 FINDINGS: CT HEAD FINDINGS No intraparenchymal hemorrhage, mass effect, midline shift or acute large vascular territory infarct. 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 Ventriculoperitoneal shunt via a RIGHT parietal burr hole, with distal tip terminating in LEFT frontal horn, unchanged. Mild to moderate ventriculomegaly, decreased from prior imaging, no CT findings of hydrocephalus. No abnormal extra-axial fluid collections. Basal cisterns are patent. No skull fracture. CT MAXILLOFACIAL FINDINGS RIGHT facial/ preauricular mild soft tissue swelling, reticulated fat with minimal inflammatory changes superficial lobe of the RIGHT parotid gland. No parotid masses, ductal dilatation or sialolith. No radiopaque foreign bodies. Ocular globes and orbital contents are normal. Paranasal sinuses and mastoid air cells are well aerated. RIGHT jugular bulb dehiscence. No destructive bony lesions. Included airway is patent. Ventriculoperitoneal shunt courses through RIGHT neck, visualized portion is contiguous. Possible cerebellar DVA use though, incompletely characterized. IMPRESSION: CT HEAD: No acute intracranial process. Slightly decreased ventriculomegaly, stable positioning of ventriculoperitoneal shunt. 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 CT MAXILLOFACIAL: Mild RIGHT facial/preauricular soft tissue swelling most compatible cellulitis without drainable fluid collection. Mild inflammation of the parotid gland is likely reactive, less  likely parotiditis. Electronically Signed   By: Awilda Metro M.D.   On: 10/25/2015 22:35   Dg Chest Portable 1 View  10/25/2015  CLINICAL DATA:  Swelling on the right side of the face for 3 days. VP shunt placed in 2000. History of stroke and left-sided weakness. EXAM: PORTABLE CHEST 1 VIEW COMPARISON:  Chest radiograph 07/02/2010 FINDINGS: Prominent interstitial lung markings bilaterally with mild peribronchial thickening. Mild fullness in the perihilar regions. Heart size is within normal limits. The trachea is midline. There is a right VP shunt. IMPRESSION: Prominent interstitial lung markings with perihilar fullness. Findings are concerning for mild pulmonary edema. Electronically Signed   By: Richarda Overlie M.D.   On: 10/25/2015 21:28    Medications:  I have reviewed the patient's current medications. Scheduled: . aspirin  300 mg Rectal Daily   Or  . aspirin  325 mg Oral Daily  . clindamycin (CLEOCIN) IV  900 mg Intravenous Q8H  . enoxaparin (LOVENOX) injection  40 mg Subcutaneous Q24H  . FLUoxetine  60 mg Oral Daily  . gabapentin  300 mg Oral TID  . levETIRAcetam  250 mg Oral BID  . mometasone-formoterol  2 puff Inhalation BID  . pregabalin  150 mg Oral TID  . QUEtiapine  100 mg Oral QHS  . topiramate  400 mg Oral BID  . traZODone  300 mg Oral QHS    Assessment/Plan: Symptoms continue. MRI of the cervical spine reviewed and shows nothing to explain current presenting symptoms.  Patient with past history of stroke and on no antiplatelet therapy at home.  Echocardiogram performed and normal with EF of 55-60%.  Recommendations: 1.  Carotid doppler.  May be performed as an outpatient.   2.  Continue ASA daily.     LOS: 2 days   Thana Farr, MD [facility] [phone] 10/27/2015  9:28 AM

## 2015-10-27 NOTE — Evaluation (Signed)
Occupational Therapy Evaluation Patient Details Name: Kelli Miranda MRN: [id number] DOB: 01-Jan-1976 Today's Date: 10/27/2015    History of Present Illness Kelli Miranda is an 39 y.o. female with a past medical history significant for HTN, DM, heavy smoker, ischemic right thalamic stroke approximately 4 months ago without residual mild left sided weakness, deficit, ventriculoperitoneal shunt, and COPD, sent to the ED from her PCP office for evaluation of right facial swelling. In ED pt also reported RUE weakness and numbness MRI head negative   Clinical Impression   Pt focused on going home today.  Evaluation limited to EOB by pt.  Likely performing at her baseline. Inconsistencies reported in mobility by nursing staff. Has all necessary equipment and good support.  No further OT needs.   Follow Up Recommendations  No OT follow up    Equipment Recommendations  None recommended by OT    Recommendations for Other Services       Precautions / Restrictions Precautions Precautions: Fall      Mobility Bed Mobility Overal bed mobility: Modified Independent             General bed mobility comments: no difficulty  Transfers                 General transfer comment: pt refused OOB with therapist    Balance     Sitting balance-Leahy Scale: Good                                      ADL Overall ADL's : At baseline                                             Vision     Perception     Praxis      Pertinent Vitals/Pain Pain Assessment: Faces Faces Pain Scale: No hurt     Hand Dominance Right   Extremity/Trunk Assessment Upper Extremity Assessment Upper Extremity Assessment: Overall WFL for tasks assessed;RUE deficits/detail (hx of CVA with residual weakness) RUE Deficits / Details: reports numbness and tingling, but uses well functionally. Observed pt drinking, pulling up blanket and hugging her aunt    Lower Extremity Assessment Lower Extremity Assessment: Defer to PT evaluation   Cervical / Trunk Assessment Cervical / Trunk Assessment: Normal   Communication Communication Communication: No difficulties   Cognition Arousal/Alertness: Awake/alert Behavior During Therapy: Impulsive Overall Cognitive Status: History of cognitive impairments - at baseline (impulsive, poor safety awareness)                     General Comments       Exercises       Shoulder Instructions      Home Living Family/patient expects to be discharged to:: Private residence Living Arrangements: Children;Other relatives (daughter and her fiance) Available Help at Discharge: Family;Personal care attendant (aide 8 hours a day) Type of Home: House Home Access: Stairs to enter [employer] of Steps: 3 Entrance Stairs-Rails: Right;Left       Bathroom Shower/Tub: Producer, television/film/video: Standard     Home Equipment: Environmental consultant - 4 wheels;Bedside commode;Wheelchair - manual;Shower seat          Prior Functioning/Environment Level of Independence: Needs assistance  Gait / Transfers Assistance Needed: walks with 4  wheeled walker only with assist and short distances (~15-20 ft); also uses wheelchair if alone ADL's / Homemaking Assistance Needed: aide helps with IADL, pt reports she can bathe and dress herself   Comments: Pt reports wanting her aide longer for the company.    OT Diagnosis: Generalized weakness;Cognitive deficits   OT Problem List:     OT Treatment/Interventions:      OT Goals(Current goals can be found in the care plan section) Acute Rehab OT Goals Patient Stated Goal[facility]  OT Frequency:     Barriers to D/C:            Co-evaluation              End of Session    Activity Tolerance: Patient tolerated treatment well (self limiting) Patient left: in bed;with call bell/phone within reach;with bed alarm  set;with nursing/sitter in room   Time: 1044-1100 OT Time Calculation (min): 16 min Charges:  OT General Charges $OT Visit: 1 Procedure OT Evaluation $Initial OT Evaluation Tier I: 1 Procedure G-Codes:    Evern BioMayberry, Jhania Etherington Lynn 10/27/2015, 11:02 AM  [phone]

## 2015-10-27 NOTE — Progress Notes (Signed)
Patient is discharged from room 5M19 at this time. Alert and in stable condition. IV site d/c'd as well as tele. Instructions read to patient and understanding verbalized. Left unit via wheelchair with family and all belongings at side.

## 2016-01-29 ENCOUNTER — Ambulatory Visit: Payer: Self-pay | Admitting: Family Medicine

## 2016-01-30 ENCOUNTER — Encounter: Payer: Self-pay | Admitting: Family Medicine

## 2016-01-31 ENCOUNTER — Ambulatory Visit (INDEPENDENT_AMBULATORY_CARE_PROVIDER_SITE_OTHER): Payer: Medicare Other

## 2016-01-31 ENCOUNTER — Encounter: Payer: Self-pay | Admitting: Family Medicine

## 2016-01-31 ENCOUNTER — Ambulatory Visit (INDEPENDENT_AMBULATORY_CARE_PROVIDER_SITE_OTHER): Payer: Medicare Other | Admitting: Family Medicine

## 2016-01-31 VITALS — BP 83/59 | HR 80 | Temp 96.6°F | Ht 60.0 in | Wt 149.0 lb

## 2016-01-31 DIAGNOSIS — M6289 Other specified disorders of muscle: Secondary | ICD-10-CM

## 2016-01-31 DIAGNOSIS — M25572 Pain in left ankle and joints of left foot: Secondary | ICD-10-CM

## 2016-01-31 DIAGNOSIS — Z794 Long term (current) use of insulin: Secondary | ICD-10-CM | POA: Diagnosis not present

## 2016-01-31 DIAGNOSIS — E1340 Other specified diabetes mellitus with diabetic neuropathy, unspecified: Secondary | ICD-10-CM

## 2016-01-31 DIAGNOSIS — F17200 Nicotine dependence, unspecified, uncomplicated: Secondary | ICD-10-CM | POA: Insufficient documentation

## 2016-01-31 DIAGNOSIS — I639 Cerebral infarction, unspecified: Secondary | ICD-10-CM

## 2016-01-31 DIAGNOSIS — R531 Weakness: Secondary | ICD-10-CM

## 2016-01-31 LAB — GLUCOSE, POCT (MANUAL RESULT ENTRY): POC Glucose: 82 mg/dl (ref 70–99)

## 2016-01-31 LAB — POCT GLYCOSYLATED HEMOGLOBIN (HGB A1C): Hemoglobin A1C: 4.9

## 2016-01-31 NOTE — Progress Notes (Signed)
Subjective:  Patient ID: Kelli Miranda, female    DOB: 1976-07-15  Age: 40 y.o. MRN: [id number]  CC: Establish Care and Foot Pain   HPI Kelli Miranda presents for increasing pain in the left foot. This been going on for a week or 2. She recently was discharged from [facility] after rehabilitation from a CVA. She states she is a diabetic who has taken insulin in the past but quit taking her insulin. She admits to smoking 2+ packs daily. She also has pain in the legs and hands for which she takes Lyrica and hydrocodone. She has had seizures for which she takes Keppra. Other meds as noted below.  History Kelli Miranda has a past medical history of Stroke [facility]); Diabetes mellitus without complication (HCC); Hypertension; and COPD (chronic obstructive pulmonary disease) (HCC).   She has past surgical history that includes Knee surgery and Ventriculoperitoneal shunt.   Her family history is not on file.She reports that she has been smoking Cigarettes.  She started smoking about 22 years ago. She has been smoking about 2.00 packs per day. She does not have any smokeless tobacco history on file. She reports that she drinks alcohol. Her drug history is not on file.  Current Outpatient Prescriptions on File Prior to Visit  Medication Sig Dispense Refill  . ALPRAZolam (XANAX) 0.5 MG tablet Take 0.5 mg by mouth at bedtime as needed for anxiety.    Marland Kitchen aspirin EC 81 MG tablet Take 1 tablet (81 mg total) by mouth daily. 30 tablet 0  . clindamycin (CLEOCIN) 300 MG capsule Take 1 capsule (300 mg total) by mouth 4 (four) times daily. 32 capsule 0  . diclofenac sodium (VOLTAREN) 1 % GEL Apply 2 g topically daily as needed. For knee pain    . Fluticasone-Salmeterol (ADVAIR) 250-50 MCG/DOSE AEPB Inhale 1 puff into the lungs 2 (two) times daily as needed. For shortness of breath    . HYDROcodone-acetaminophen (NORCO) 10-325 MG tablet Take 1 tablet by mouth every 6 (six) hours as needed for moderate  pain.    Marland Kitchen levETIRAcetam (KEPPRA) 250 MG tablet Take 250 mg by mouth 2 (two) times daily.    . ondansetron (ZOFRAN-ODT) 4 MG disintegrating tablet Take 4 mg by mouth 2 (two) times daily. Take every day per patient and family members    . pregabalin (LYRICA) 50 MG capsule Take 150 mg by mouth 3 (three) times daily.    . promethazine (PHENERGAN) 25 MG tablet Take 25 mg by mouth every 6 (six) hours as needed for nausea or vomiting.    Marland Kitchen QUEtiapine (SEROQUEL) 100 MG tablet Take 100 mg by mouth at bedtime.    . topiramate (TOPAMAX) 200 MG tablet Take 400 mg by mouth 2 (two) times daily.    . traZODone (DESYREL) 100 MG tablet Take 300 mg by mouth at bedtime.    . vitamin B-12 500 MCG tablet Take 1 tablet (500 mcg total) by mouth daily. 30 tablet 0   No current facility-administered medications on file prior to visit.    ROS Review of Systems  Constitutional: Negative for fever, activity change and appetite change.  HENT: Negative for congestion, rhinorrhea and sore throat.   Eyes: Negative for visual disturbance.  Respiratory: Negative for cough and shortness of breath.   Cardiovascular: Negative for chest pain and palpitations.  Gastrointestinal: Negative for nausea, abdominal pain and diarrhea.  Genitourinary: Negative for dysuria.  Musculoskeletal: Negative for myalgias and arthralgias.    Objective:  BP  83/59 mmHg  Pulse 80  Temp(Src) 96.6 F (35.9 C) (Oral)  Ht 5' (1.524 m)  Wt 149 lb (67.586 kg)  BMI 29.10 kg/m2  SpO2 96%  Physical Exam  Constitutional: She is oriented to person, place, and time. She appears well-developed and well-nourished. No distress.  HENT:  Head: Normocephalic and atraumatic.  Right Ear: External ear normal.  Left Ear: External ear normal.  Nose: Nose normal.  Mouth/Throat: Oropharynx is clear and moist.  Eyes: Conjunctivae and EOM are normal. Pupils are equal, round, and reactive to light.  Neck: Normal range of motion. Neck supple. No thyromegaly  present.  Cardiovascular: Normal rate, regular rhythm and normal heart sounds.   No murmur heard. Pulmonary/Chest: Effort normal and breath sounds normal. No respiratory distress. She has no wheezes. She has no rales.  Abdominal: Soft. Bowel sounds are normal. She exhibits no distension. There is no tenderness.  Lymphadenopathy:    She has no cervical adenopathy.  Neurological: She is alert and oriented to person, place, and time. She has normal reflexes.  Skin: Skin is warm and dry.  Psychiatric: She has a normal mood and affect. Her behavior is normal. Judgment and thought content normal.    Assessment & Plan:   Kelli Miranda was seen today for establish care and foot pain.  Diagnoses and all orders for this visit:  Pain in joint, ankle and foot, left -     DG Foot Complete Left; Future -     DME Other see comment  Other specified diabetes mellitus with diabetic neuropathy, with long-term current use of insulin (HCC) -     POCT glucose (manual entry) -     POCT glycosylated hemoglobin (Hb A1C)  Left-sided weakness  Cerebral infarction due to unspecified mechanism  Tobacco use disorder   I have discontinued Ms. Murren's gabapentin. I am also having her maintain her ALPRAZolam, diclofenac sodium, pregabalin, HYDROcodone-acetaminophen, ondansetron, QUEtiapine, topiramate, traZODone, promethazine, levETIRAcetam, Fluticasone-Salmeterol, clindamycin, aspirin EC, cyanocobalamin, loratadine, omeprazole, potassium chloride, and FLUoxetine.  Meds ordered this encounter  Medications  . loratadine (CLARITIN) 10 MG tablet    Sig: Take 10 mg by mouth daily.  Marland Kitchen omeprazole (PRILOSEC) 40 MG capsule    Sig: Take 40 mg by mouth 2 (two) times daily.  . potassium chloride (K-DUR) 10 MEQ tablet    Sig: Take 20 mEq by mouth daily.  Marland Kitchen FLUoxetine (PROZAC) 40 MG capsule    Sig: Take 40 mg by mouth daily.     Follow-up: Return if symptoms worsen or fail to improve.  [name], M.D.

## 2016-02-06 ENCOUNTER — Ambulatory Visit: Payer: Self-pay | Admitting: Family Medicine

## 2016-03-13 ENCOUNTER — Encounter: Payer: Self-pay | Admitting: Family Medicine

## 2016-03-13 ENCOUNTER — Ambulatory Visit (INDEPENDENT_AMBULATORY_CARE_PROVIDER_SITE_OTHER): Payer: Medicare Other | Admitting: Family Medicine

## 2016-03-13 VITALS — BP 128/79 | HR 95 | Temp 97.0°F | Ht 60.0 in | Wt 139.4 lb

## 2016-03-13 DIAGNOSIS — J441 Chronic obstructive pulmonary disease with (acute) exacerbation: Secondary | ICD-10-CM

## 2016-03-13 DIAGNOSIS — I639 Cerebral infarction, unspecified: Secondary | ICD-10-CM

## 2016-03-13 MED ORDER — ALBUTEROL SULFATE HFA 108 (90 BASE) MCG/ACT IN AERS: 1.0000 | INHALATION_SPRAY | Freq: Four times a day (QID) | RESPIRATORY_TRACT | Status: DC | PRN

## 2016-03-13 MED ORDER — PREDNISONE 20 MG PO TABS: ORAL_TABLET | ORAL | Status: DC

## 2016-03-13 MED ORDER — DOXYCYCLINE HYCLATE 100 MG PO TABS: 100.0000 mg | ORAL_TABLET | Freq: Two times a day (BID) | ORAL | Status: DC

## 2016-03-13 MED ORDER — FLUTICASONE PROPIONATE 50 MCG/ACT NA SUSP: 1.0000 | Freq: Two times a day (BID) | NASAL | Status: DC | PRN

## 2016-03-13 NOTE — Progress Notes (Signed)
BP 128/79 mmHg  Pulse 95  Temp(Src) 97 F (36.1 C) (Oral)  Ht 5' (1.524 m)  Wt 139 lb 6.4 oz (63.231 kg)  BMI 27.22 kg/m2   Subjective:    Patient ID: Kelli Miranda, female    DOB: December 16, 1976, 40 y.o.   MRN: [id number]  HPI: Kelli Miranda is a 40 y.o. female presenting on 03/13/2016 for Cough, chest congestion   HPI Cough and congestion and wheezing Patient comes in today for cough and congestion and wheezing for the past 3 weeks. Patient still admits that she smoked a half pack a day which is down from the 2 packs per day that she did smoke for the past 20 years. She denies any fevers or chills. She is coughing up yellow-green phlegm. She denies any sick contacts that she knows of. She has been using multiple over-the-counter agents as Tylenol Sinus and other stuff without much success.  Relevant past medical, surgical, family and social history reviewed and updated as indicated. Interim medical history since our last visit reviewed. Allergies and medications reviewed and updated.  Review of Systems  Constitutional: Negative for fever and chills.  HENT: Positive for congestion, postnasal drip, rhinorrhea, sinus pressure, sneezing and sore throat. Negative for ear discharge and ear pain.   Eyes: Negative for pain, redness and visual disturbance.  Respiratory: Positive for cough, shortness of breath and wheezing. Negative for chest tightness.   Cardiovascular: Negative for chest pain and leg swelling.  Genitourinary: Negative for dysuria and difficulty urinating.  Musculoskeletal: Negative for back pain and gait problem.  Skin: Negative for rash.  Neurological: Negative for light-headedness and headaches.  Psychiatric/Behavioral: Negative for behavioral problems and agitation.  All other systems reviewed and are negative.   Per HPI unless specifically indicated above     Medication List       This list is accurate as of: 03/13/16  9:27 AM.  Always use your most  recent med list.               albuterol 108 (90 Base) MCG/ACT inhaler  Commonly known as:  VENTOLIN HFA  Inhale 1 puff into the lungs every 6 (six) hours as needed for wheezing or shortness of breath.     ALPRAZolam 0.5 MG tablet  Commonly known as:  XANAX  Take 0.5 mg by mouth at bedtime as needed for anxiety.     aspirin EC 81 MG tablet  Take 1 tablet (81 mg total) by mouth daily.     cyanocobalamin 500 MCG tablet  Take 1 tablet (500 mcg total) by mouth daily.     diclofenac sodium 1 % Gel  Commonly known as:  VOLTAREN  Apply 2 g topically daily as needed. For knee pain     doxycycline 100 MG tablet  Commonly known as:  VIBRA-TABS  Take 1 tablet (100 mg total) by mouth 2 (two) times daily. 1 po bid     FLUoxetine 40 MG capsule  Commonly known as:  PROZAC  Take 40 mg by mouth daily.     fluticasone 50 MCG/ACT nasal spray  Commonly known as:  FLONASE  Place 1 spray into both nostrils 2 (two) times daily as needed for allergies or rhinitis.     Fluticasone-Salmeterol 250-50 MCG/DOSE Aepb  Commonly known as:  ADVAIR  Inhale 1 puff into the lungs 2 (two) times daily as needed. For shortness of breath     HYDROcodone-acetaminophen 10-325 MG tablet  Commonly known as:  NORCO  Take 1 tablet by mouth every 6 (six) hours as needed for moderate pain.     levETIRAcetam 250 MG tablet  Commonly known as:  KEPPRA  Take 250 mg by mouth 2 (two) times daily.     loratadine 10 MG tablet  Commonly known as:  CLARITIN  Take 10 mg by mouth daily.     omeprazole 40 MG capsule  Commonly known as:  PRILOSEC  Take 40 mg by mouth 2 (two) times daily.     potassium chloride 10 MEQ tablet  Commonly known as:  K-DUR  Take 20 mEq by mouth daily.     predniSONE 20 MG tablet  Commonly known as:  DELTASONE  2 po at same time daily for 5 days     pregabalin 50 MG capsule  Commonly known as:  LYRICA  Take 150 mg by mouth 3 (three) times daily.     promethazine 25 MG tablet    Commonly known as:  PHENERGAN  Take 25 mg by mouth every 6 (six) hours as needed for nausea or vomiting.     QUEtiapine 100 MG tablet  Commonly known as:  SEROQUEL  Take 100 mg by mouth at bedtime.     topiramate 200 MG tablet  Commonly known as:  TOPAMAX  Take 400 mg by mouth 2 (two) times daily.     traZODone 100 MG tablet  Commonly known as:  DESYREL  Take 300 mg by mouth at bedtime.           Objective:    BP 128/79 mmHg  Pulse 95  Temp(Src) 97 F (36.1 C) (Oral)  Ht 5' (1.524 m)  Wt 139 lb 6.4 oz (63.231 kg)  BMI 27.22 kg/m2  Wt Readings from Last 3 Encounters:  03/13/16 139 lb 6.4 oz (63.231 kg)  01/31/16 149 lb (67.586 kg)  10/26/15 169 lb 8.5 oz (76.9 kg)    Physical Exam  Constitutional: She is oriented to person, place, and time. She appears well-developed and well-nourished. No distress.  HENT:  Right Ear: Tympanic membrane, external ear and ear canal normal.  Left Ear: Tympanic membrane, external ear and ear canal normal.  Nose: Mucosal edema and rhinorrhea present. No epistaxis. Right sinus exhibits no maxillary sinus tenderness and no frontal sinus tenderness. Left sinus exhibits no maxillary sinus tenderness and no frontal sinus tenderness.  Mouth/Throat: Uvula is midline and mucous membranes are normal. Posterior oropharyngeal edema and posterior oropharyngeal erythema present. No oropharyngeal exudate or tonsillar abscesses.  Eyes: Conjunctivae and EOM are normal.  Neck: Neck supple. No thyromegaly present.  Cardiovascular: Normal rate, regular rhythm, normal heart sounds and intact distal pulses.   No murmur heard. Pulmonary/Chest: Effort normal. No respiratory distress. She has decreased breath sounds in the right lower field and the left lower field. She has wheezes. She has no rhonchi. She has no rales.  Musculoskeletal: Normal range of motion. She exhibits no edema or tenderness.  Lymphadenopathy:    She has no cervical adenopathy.   Neurological: She is alert and oriented to person, place, and time. Coordination normal.  Skin: Skin is warm and dry. No rash noted. She is not diaphoretic.  Psychiatric: She has a normal mood and affect. Her behavior is normal.  Vitals reviewed.   Results for orders placed or performed in visit on 01/31/16  POCT glucose (manual entry)  Result Value Ref Range   POC Glucose 82 70 - 99 mg/dl  POCT glycosylated hemoglobin (Hb A1C)  Result Value Ref Range   Hemoglobin A1C 4.9       Assessment & Plan:   Problem List Items Addressed This Visit    None    Visit Diagnoses    COPD exacerbation (HCC)    -  Primary    Relevant Medications    albuterol (VENTOLIN HFA) 108 (90 Base) MCG/ACT inhaler    predniSONE (DELTASONE) 20 MG tablet    fluticasone (FLONASE) 50 MCG/ACT nasal spray    doxycycline (VIBRA-TABS) 100 MG tablet        Follow up plan: Return in about 2 weeks (around 03/27/2016), or if symptoms worsen or fail to improve, for Recheck COPD.  Counseling provided for all of the vaccine components No orders of the defined types were placed in this encounter.    Arville Care, MD [facility] 03/13/2016, 9:27 AM

## 2016-03-18 ENCOUNTER — Telehealth: Payer: Self-pay | Admitting: Family Medicine

## 2016-03-18 NOTE — Telephone Encounter (Signed)
Patient aware and I called pharmacy for her and the rx for Doxycycline is waiting there waiting on patient.

## 2016-03-18 NOTE — Telephone Encounter (Signed)
Doxycycline was sent in on the 15th, go ahead and take that if she hasn't already

## 2016-03-20 ENCOUNTER — Other Ambulatory Visit: Payer: Self-pay | Admitting: Family Medicine

## 2016-03-20 ENCOUNTER — Telehealth: Payer: Self-pay | Admitting: Family Medicine

## 2016-03-20 MED ORDER — AZITHROMYCIN 250 MG PO TABS: ORAL_TABLET | ORAL | Status: DC

## 2016-03-20 NOTE — Telephone Encounter (Signed)
The requested med has been sent to the pharmacy.  Please let the patient know. Thanks, WS 

## 2016-03-20 NOTE — Telephone Encounter (Signed)
Patient aware.

## 2016-03-29 ENCOUNTER — Ambulatory Visit: Payer: Medicare Other | Admitting: Family Medicine

## 2016-04-01 ENCOUNTER — Encounter: Payer: Self-pay | Admitting: Family Medicine

## 2016-04-02 ENCOUNTER — Encounter (INDEPENDENT_AMBULATORY_CARE_PROVIDER_SITE_OTHER): Payer: Self-pay

## 2016-04-02 ENCOUNTER — Encounter: Payer: Self-pay | Admitting: Family Medicine

## 2016-04-02 ENCOUNTER — Ambulatory Visit (INDEPENDENT_AMBULATORY_CARE_PROVIDER_SITE_OTHER): Payer: Medicare Other | Admitting: Family Medicine

## 2016-04-02 ENCOUNTER — Encounter: Payer: Self-pay | Admitting: *Deleted

## 2016-04-02 VITALS — BP 93/69 | HR 96 | Temp 97.0°F | Ht 60.0 in | Wt 138.2 lb

## 2016-04-02 DIAGNOSIS — M542 Cervicalgia: Secondary | ICD-10-CM | POA: Diagnosis not present

## 2016-04-02 DIAGNOSIS — I639 Cerebral infarction, unspecified: Secondary | ICD-10-CM

## 2016-04-02 DIAGNOSIS — J441 Chronic obstructive pulmonary disease with (acute) exacerbation: Secondary | ICD-10-CM | POA: Diagnosis not present

## 2016-04-02 MED ORDER — CYCLOBENZAPRINE HCL 10 MG PO TABS: 10.0000 mg | ORAL_TABLET | Freq: Three times a day (TID) | ORAL | Status: DC | PRN

## 2016-04-02 NOTE — Patient Instructions (Signed)
Increase Advair dosing to twice daily and stay with that as maintenance.  Increase the albuterol inhaler to 2 puffs 4 times a day until congestion is completely resolved. Then you can cut it back to as needed.

## 2016-04-02 NOTE — Progress Notes (Signed)
Subjective:  Patient ID: Kelli Miranda, female    DOB: May 06, 1976  Age: 40 y.o. MRN: [id number]  CC: COPD   HPI ROBERTO HLAVATY presents for fell 4 days ago. Went to [facility] 2 days ago for neck pain. Added flexeril to regimen. Needs refill  Still some chest congestion. Just using advair qday and albuterol BID   History [name] has a past medical history of Stroke [facility]); Diabetes mellitus without complication (HCC); Hypertension; and COPD (chronic obstructive pulmonary disease) (HCC).   She has past surgical history that includes Knee surgery and Ventriculoperitoneal shunt.   Her family history is not on file.She reports that she has been smoking Cigarettes.  She started smoking about 22 years ago. She has been smoking about 2.00 packs per day. She does not have any smokeless tobacco history on file. She reports that she drinks alcohol. Her drug history is not on file.    ROS Review of Systems  Constitutional: Negative for fever, activity change and appetite change.  HENT: Positive for congestion. Negative for rhinorrhea and sore throat.   Eyes: Negative for visual disturbance.  Respiratory: Positive for cough and shortness of breath.   Cardiovascular: Negative for chest pain and palpitations.  Gastrointestinal: Negative for nausea, abdominal pain and diarrhea.  Genitourinary: Negative for dysuria.  Musculoskeletal: Positive for myalgias, back pain, arthralgias and neck pain.    Objective:  BP 93/69 mmHg  Pulse 96  Temp(Src) 97 F (36.1 C) (Oral)  Ht 5' (1.524 m)  Wt 138 lb 3.2 oz (62.687 kg)  BMI 26.99 kg/m2  SpO2 96%  BP Readings from Last 3 Encounters:  04/02/16 93/69  03/13/16 128/79  01/31/16 83/59    Wt Readings from Last 3 Encounters:  04/02/16 138 lb 3.2 oz (62.687 kg)  03/13/16 139 lb 6.4 oz (63.231 kg)  01/31/16 149 lb (67.586 kg)     Physical Exam  Constitutional: She is oriented to person, place, and time. She appears well-developed and  well-nourished. No distress.  HENT:  Head: Normocephalic and atraumatic.  Right Ear: External ear normal.  Left Ear: External ear normal.  Nose: Nose normal.  Mouth/Throat: Oropharynx is clear and moist.  Eyes: Conjunctivae and EOM are normal. Pupils are equal, round, and reactive to light.  Neck: Normal range of motion. Neck supple. No thyromegaly present.  Cardiovascular: Normal rate, regular rhythm and normal heart sounds.   No murmur heard. Pulmonary/Chest: Effort normal and breath sounds normal. No respiratory distress. She has no wheezes. She has no rales.  Abdominal: Soft. Bowel sounds are normal. She exhibits no distension. There is no tenderness.  Musculoskeletal: Normal range of motion. Tenderness: posterior cervical spinalis.  Lymphadenopathy:    She has no cervical adenopathy.  Neurological: She is alert and oriented to person, place, and time. She has normal reflexes.  Skin: Skin is warm and dry.  Psychiatric: She has a normal mood and affect. Her behavior is normal. Judgment and thought content normal.     Lab Results  Component Value Date   WBC 4.3 10/27/2015   HGB 11.0* 10/27/2015   HCT 34.1* 10/27/2015   PLT 186 10/27/2015   GLUCOSE 91 10/27/2015   CHOL 155 10/26/2015   TRIG 165* 10/26/2015   HDL 32* 10/26/2015   LDLCALC 90 10/26/2015   ALT 15 07/04/2010   AST 12 07/04/2010   NA 140 10/27/2015   K 3.7 10/27/2015   CL 106 10/27/2015   CREATININE 0.96 10/27/2015   BUN 7 10/27/2015  CO2 24 10/27/2015   TSH 0.987 10/27/2015   HGBA1C 4.9 01/31/2016    Ct Head Wo Contrast  10/25/2015  CLINICAL DATA:  RIGHT-sided facial swelling, stroke-like symptoms with RIGHT upper extremity weakness for 2 days. History of diabetes, hypertension and stroke. EXAM: CT HEAD WITHOUT CONTRAST CT MAXILLOFACIAL WITH CONTRAST TECHNIQUE: Multidetector CT imaging of the head was performed using the standard protocol without IV contrast. CT imaging of the maxillofacial structures was  performed with intravenous contrast. Multiplanar CT image reconstructions were also generated. CONTRAST:  75mL OMNIPAQUE IOHEXOL 300 MG/ML  SOLN COMPARISON:  CT head June 29, 2015 MRI of the brain June 29, 2015 FINDINGS: CT HEAD FINDINGS No intraparenchymal hemorrhage, mass effect, midline shift or acute large vascular territory infarct. 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 Ventriculoperitoneal shunt via a RIGHT parietal burr hole, with distal tip terminating in LEFT frontal horn, unchanged. Mild to moderate ventriculomegaly, decreased from prior imaging, no CT findings of hydrocephalus. No abnormal extra-axial fluid collections. Basal cisterns are patent. No skull fracture. CT MAXILLOFACIAL FINDINGS RIGHT facial/ preauricular mild soft tissue swelling, reticulated fat with minimal inflammatory changes superficial lobe of the RIGHT parotid gland. No parotid masses, ductal dilatation or sialolith. No radiopaque foreign bodies. Ocular globes and orbital contents are normal. Paranasal sinuses and mastoid air cells are well aerated. RIGHT jugular bulb dehiscence. No destructive bony lesions. Included airway is patent. Ventriculoperitoneal shunt courses through RIGHT neck, visualized portion is contiguous. Possible cerebellar DVA use though, incompletely characterized. IMPRESSION: CT HEAD: No acute intracranial process. Slightly decreased ventriculomegaly, stable positioning of ventriculoperitoneal shunt. 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 CT MAXILLOFACIAL: Mild RIGHT facial/preauricular soft tissue swelling most compatible cellulitis without drainable fluid collection. Mild inflammation of the parotid gland is likely reactive, less likely parotiditis. Electronically Signed   By: Awilda Metro M.D.   On: 10/25/2015 22:35   Mr Brain Wo Contrast  10/26/2015  CLINICAL DATA:  New onset RIGHT-sided weakness. History of ventriculoperitoneal shunt and stroke 4 months ago. History of diabetes, hypertension. EXAM:  MRI HEAD WITHOUT CONTRAST TECHNIQUE: Multiplanar, multiecho pulse sequences of the brain and surrounding structures were obtained without intravenous contrast. COMPARISON:  CT head October 25, 2015 at 2133 hours and MRI of the brain June 30th 2016 and MRI of the brain July 03, 2010 FINDINGS: No reduced diffusion to suggest acute ischemia. No susceptibility artifact is suggests hemorrhage. Ventriculoperitoneal shunt via RIGHT parietal approach with distal tip in the frontal horn of LEFT lateral ventricle. Mild gliosis along the catheter tract, unchanged. Slightly decreased ventricle size from prior imaging, no hydrocephalus. Cystic RIGHT thalamic lacunar infarct which was acute on prior MRI. Minimal nonspecific white matter T2 hyperintensities. No midline shift, mass effect or mass lesions. Chronic trace holohemispheric subdural hematomas with intermediate signal. No new extra-axial fluid collection. Normal major intracranial vascular flow voids seen at the skull base. Included ocular globes and orbital contents are unremarkable. Paranasal sinuses and mastoid air cells are well aerated. No abnormal sellar expansion. No suspicious calvarial bone marrow signal. IMPRESSION: No acute intracranial process, specifically no acute ischemia. 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 RIGHT ventriculoperitoneal shunt in place, no hydrocephalus. 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 Electronically Signed   By: Awilda Metro M.D.   On: 10/26/2015 00:27   Mr Cervical Spine Wo Contrast  10/26/2015  CLINICAL DATA:  Left-sided numbness. EXAM: MRI CERVICAL SPINE WITHOUT CONTRAST TECHNIQUE: Multiplanar, multisequence MR imaging of the cervical spine was performed. No intravenous contrast was administered. COMPARISON:  07/03/2010 FINDINGS:  No marrow signal abnormality suggestive of fracture, infection, or neoplasm. Chronic calvarial thickening and dural thickening in the posterior fossa. Related engorged  appearance of the cervical venous plexus, giving an effaced appearance to the thecal sac and prominent cord size. When compared to prior, there is no indication of cord swelling and cord signal is normal. 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 This is stable since at least 2010. No notable degenerative change in the cervical spine. No stenosis or impingement. IMPRESSION: 1. No impingement or cord lesion to explain sensory deficit. 2. Chronic calvarial and dural thickening, presumably shunt related. Electronically Signed   By: Marnee Spring M.D.   On: 10/26/2015 18:31   Ct Maxillofacial W/cm  10/25/2015  CLINICAL DATA:  RIGHT-sided facial swelling, stroke-like symptoms with RIGHT upper extremity weakness for 2 days. History of diabetes, hypertension and stroke. EXAM: CT HEAD WITHOUT CONTRAST CT MAXILLOFACIAL WITH CONTRAST TECHNIQUE: Multidetector CT imaging of the head was performed using the standard protocol without IV contrast. CT imaging of the maxillofacial structures was performed with intravenous contrast. Multiplanar CT image reconstructions were also generated. CONTRAST:  75mL OMNIPAQUE IOHEXOL 300 MG/ML  SOLN COMPARISON:  CT head June 29, 2015 MRI of the brain June 29, 2015 FINDINGS: CT HEAD FINDINGS No intraparenchymal hemorrhage, mass effect, midline shift or acute large vascular territory infarct. 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 Ventriculoperitoneal shunt via a RIGHT parietal burr hole, with distal tip terminating in LEFT frontal horn, unchanged. Mild to moderate ventriculomegaly, decreased from prior imaging, no CT findings of hydrocephalus. No abnormal extra-axial fluid collections. Basal cisterns are patent. No skull fracture. CT MAXILLOFACIAL FINDINGS RIGHT facial/ preauricular mild soft tissue swelling, reticulated fat with minimal inflammatory changes superficial lobe of  the RIGHT parotid gland. No parotid masses, ductal dilatation or sialolith. No radiopaque foreign bodies. Ocular globes and orbital contents are normal. Paranasal sinuses and mastoid air cells are well aerated. RIGHT jugular bulb dehiscence. No destructive bony lesions. Included airway is patent. Ventriculoperitoneal shunt courses through RIGHT neck, visualized portion is contiguous. Possible cerebellar DVA use though, incompletely characterized. IMPRESSION: CT HEAD: No acute intracranial process. Slightly decreased ventriculomegaly, stable positioning of ventriculoperitoneal shunt. 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 CT MAXILLOFACIAL: Mild RIGHT facial/preauricular soft tissue swelling most compatible cellulitis without drainable fluid collection. Mild inflammation of the parotid gland is likely reactive, less likely parotiditis. Electronically Signed   By: Awilda Metro M.D.   On: 10/25/2015 22:35   Dg Chest Portable 1 View  10/25/2015  CLINICAL DATA:  Swelling on the right side of the face for 3 days. VP shunt placed in 2000. History of stroke and left-sided weakness. EXAM: PORTABLE CHEST 1 VIEW COMPARISON:  Chest radiograph 07/02/2010 FINDINGS: Prominent interstitial lung markings bilaterally with mild peribronchial thickening. Mild fullness in the perihilar regions. Heart size is within normal limits. The trachea is midline. There is a right VP shunt. IMPRESSION: Prominent interstitial lung markings with perihilar fullness. Findings are concerning for mild pulmonary edema. Electronically Signed   By: Richarda Overlie M.D.   On: 10/25/2015 21:28    Assessment & Plan:   Toryn was seen today for copd.  Diagnoses and all orders for this visit:  COPD exacerbation (HCC)  Cervicalgia -     cyclobenzaprine (FLEXERIL) 10 MG tablet; Take 1 tablet (10 mg total) by mouth 3 (three) times daily as needed for muscle spasms. -  Ambulatory referral to Physical Therapy     Increase Advair dosing to  twice daily and stay with that as maintenance.  Increase the albuterol inhaler to 2 puffs 4 times a day until congestion is completely resolved. Then you can cut it back to as needed.   I have discontinued Ms. Rotenberry's potassium chloride, predniSONE, and azithromycin. I am also having her start on cyclobenzaprine. Additionally, I am having her maintain her ALPRAZolam, diclofenac sodium, pregabalin, HYDROcodone-acetaminophen, QUEtiapine, topiramate, traZODone, promethazine, levETIRAcetam, Fluticasone-Salmeterol, aspirin EC, cyanocobalamin, loratadine, omeprazole, FLUoxetine, albuterol, and fluticasone.  Meds ordered this encounter  Medications  . cyclobenzaprine (FLEXERIL) 10 MG tablet    Sig: Take 1 tablet (10 mg total) by mouth 3 (three) times daily as needed for muscle spasms.    Dispense:  90 tablet    Refill:  1     Follow-up: Return if symptoms worsen or fail to improve.  Mechele ClaudeWarren Avilyn Virtue, M.D.

## 2016-04-11 ENCOUNTER — Other Ambulatory Visit: Payer: Self-pay | Admitting: Family Medicine

## 2016-04-15 ENCOUNTER — Ambulatory Visit: Payer: Medicare Other | Admitting: Physical Therapy

## 2016-04-18 ENCOUNTER — Other Ambulatory Visit: Payer: Self-pay | Admitting: Family Medicine

## 2016-04-22 ENCOUNTER — Ambulatory Visit: Payer: Medicare Other | Admitting: Physical Therapy

## 2016-04-24 ENCOUNTER — Telehealth: Payer: Self-pay | Admitting: Family Medicine

## 2016-04-24 MED ORDER — TIZANIDINE HCL 6 MG PO CAPS: 6.0000 mg | ORAL_CAPSULE | Freq: Three times a day (TID) | ORAL | Status: DC

## 2016-04-24 NOTE — Telephone Encounter (Signed)
Order home health for PT zanaflex sent for muscle relaxer. DC Cyclobenzaprine

## 2016-04-26 NOTE — Telephone Encounter (Signed)
Pt notified of rx and HH PT Verbalizes understanding

## 2016-05-10 ENCOUNTER — Other Ambulatory Visit: Payer: Self-pay | Admitting: Family Medicine

## 2016-05-25 ENCOUNTER — Other Ambulatory Visit: Payer: Self-pay | Admitting: Family Medicine

## 2016-06-12 ENCOUNTER — Other Ambulatory Visit: Payer: Self-pay | Admitting: Family Medicine

## 2016-07-08 ENCOUNTER — Other Ambulatory Visit: Payer: Self-pay | Admitting: Family Medicine

## 2016-08-05 ENCOUNTER — Telehealth: Payer: Self-pay | Admitting: Family Medicine

## 2016-08-13 ENCOUNTER — Other Ambulatory Visit: Payer: Self-pay | Admitting: Family Medicine

## 2016-08-13 NOTE — Telephone Encounter (Signed)
Pt is not sure what is needed. [facility] doesn't need anything acording to them?

## 2016-08-15 ENCOUNTER — Telehealth: Payer: Self-pay | Admitting: Family Medicine

## 2016-08-15 NOTE — Telephone Encounter (Signed)
Attempted to contact patient.  Busy signal.  

## 2016-08-16 ENCOUNTER — Telehealth: Payer: Self-pay | Admitting: Family Medicine

## 2016-08-19 ENCOUNTER — Other Ambulatory Visit: Payer: Self-pay | Admitting: Family Medicine

## 2016-08-19 NOTE — Telephone Encounter (Signed)
Okay please write. Thanks ws

## 2016-08-19 NOTE — Telephone Encounter (Signed)
Spoke to pt and they are saying [facility] need [facility] and write [facility] new rx for oxygen. On 8/15 Gina had called [facility] and her note states they didn't need anything from us. Will attempt to call [facility] back.

## 2016-09-12 NOTE — Telephone Encounter (Signed)
Several attempts have been made to contact patient this encounter will be closed.  

## 2016-10-02 ENCOUNTER — Ambulatory Visit (INDEPENDENT_AMBULATORY_CARE_PROVIDER_SITE_OTHER): Payer: Medicare Other

## 2016-10-02 ENCOUNTER — Encounter: Payer: Self-pay | Admitting: Family Medicine

## 2016-10-02 ENCOUNTER — Ambulatory Visit (INDEPENDENT_AMBULATORY_CARE_PROVIDER_SITE_OTHER): Payer: Medicare Other | Admitting: Family Medicine

## 2016-10-02 VITALS — BP 98/69 | HR 77 | Temp 96.9°F | Ht 60.0 in | Wt 154.6 lb

## 2016-10-02 DIAGNOSIS — I69354 Hemiplegia and hemiparesis following cerebral infarction affecting left non-dominant side: Secondary | ICD-10-CM | POA: Diagnosis not present

## 2016-10-02 DIAGNOSIS — K529 Noninfective gastroenteritis and colitis, unspecified: Secondary | ICD-10-CM

## 2016-10-02 DIAGNOSIS — J441 Chronic obstructive pulmonary disease with (acute) exacerbation: Secondary | ICD-10-CM | POA: Diagnosis not present

## 2016-10-02 DIAGNOSIS — R531 Weakness: Secondary | ICD-10-CM

## 2016-10-02 DIAGNOSIS — Z23 Encounter for immunization: Secondary | ICD-10-CM

## 2016-10-02 LAB — CBC WITH DIFFERENTIAL/PLATELET
Basophils Absolute: 0 10*3/uL (ref 0.0–0.2)
Basos: 0 %
EOS (ABSOLUTE): 0.1 10*3/uL (ref 0.0–0.4)
EOS: 2 %
HEMATOCRIT: 37.7 % (ref 34.0–46.6)
Hemoglobin: 12.4 g/dL (ref 11.1–15.9)
IMMATURE GRANS (ABS): 0 10*3/uL (ref 0.0–0.1)
IMMATURE GRANULOCYTES: 0 %
LYMPHS: 32 %
Lymphocytes Absolute: 1.6 10*3/uL (ref 0.7–3.1)
MCH: 29.8 pg (ref 26.6–33.0)
MCHC: 32.9 g/dL (ref 31.5–35.7)
MCV: 91 fL (ref 79–97)
MONOCYTES: 6 %
Monocytes Absolute: 0.3 10*3/uL (ref 0.1–0.9)
NEUTROS ABS: 3.1 10*3/uL (ref 1.4–7.0)
Neutrophils: 60 %
Platelets: 224 10*3/uL (ref 150–379)
RBC: 4.16 x10E6/uL (ref 3.77–5.28)
RDW: 14.7 % (ref 12.3–15.4)
WBC: 5.2 10*3/uL (ref 3.4–10.8)

## 2016-10-02 LAB — CMP14+EGFR
A/G RATIO: 1.9 (ref 1.2–2.2)
ALT: 23 IU/L (ref 0–32)
AST: 13 IU/L (ref 0–40)
Albumin: 4.1 g/dL (ref 3.5–5.5)
Alkaline Phosphatase: 108 IU/L (ref 39–117)
BUN/Creatinine Ratio: 9 (ref 9–23)
BUN: 9 mg/dL (ref 6–24)
Bilirubin Total: 0.2 mg/dL (ref 0.0–1.2)
CALCIUM: 9.2 mg/dL (ref 8.7–10.2)
CO2: 23 mmol/L (ref 18–29)
Chloride: 102 mmol/L (ref 96–106)
Creatinine, Ser: 0.99 mg/dL (ref 0.57–1.00)
GFR, EST AFRICAN AMERICAN: 82 mL/min/{1.73_m2} (ref >59)
GFR, EST NON AFRICAN AMERICAN: 71 mL/min/{1.73_m2} (ref >59)
GLOBULIN, TOTAL: 2.2 g/dL (ref 1.5–4.5)
Glucose: 72 mg/dL (ref 65–99)
POTASSIUM: 4.1 mmol/L (ref 3.5–5.2)
Sodium: 138 mmol/L (ref 134–144)
TOTAL PROTEIN: 6.3 g/dL (ref 6.0–8.5)

## 2016-10-02 MED ORDER — AZITHROMYCIN 250 MG PO TABS: ORAL_TABLET | ORAL | 0 refills | Status: DC

## 2016-10-02 MED ORDER — DIPHENOXYLATE-ATROPINE 2.5-0.025 MG PO TABS: 2.0000 | ORAL_TABLET | Freq: Four times a day (QID) | ORAL | 0 refills | Status: DC | PRN

## 2016-10-02 NOTE — Progress Notes (Signed)
Subjective:  Patient ID: Kelli Miranda, female    DOB: [birth year]  Age: 40 y.o. MRN: [id number]  CC: Cough; Nasal Congestion; diarrhea; and Nausea   HPI Kelli Miranda presents for as above. Last emesis 3-4 days ago. LBM - TNTC yesterday.  Pepto not helping. Patient presents with upper respiratory congestion. Rhinorrhea that is frequently purulent. There is moderate sore throat. Patient reports coughing frequently as well.-colored/purulent sputum noted. There is no fever no chills no sweats. The patient denies being short of breath. Onset was 3-5 days ago. Gradually worsening in spite of home remedies.    History Kelli Miranda has a past medical history of COPD (chronic obstructive pulmonary disease) ([place]); Diabetes mellitus without complication ([place]); Hypertension; and Stroke [facility]).   She has a past surgical history that includes Knee surgery and Ventriculoperitoneal shunt.   Her family history is not on file.She reports that she has been smoking Cigarettes.  She started smoking about 22 years ago. She has been smoking about 2.00 packs per day. She does not have any smokeless tobacco history on file. She reports that she drinks alcohol. Her drug history is not on file.    ROS Review of Systems  Constitutional: Negative for activity change, appetite change, chills and fever.  HENT: Positive for congestion, postnasal drip, rhinorrhea and sinus pressure. Negative for ear discharge, ear pain, hearing loss, nosebleeds, sneezing and trouble swallowing.   Respiratory: Negative for chest tightness and shortness of breath.   Cardiovascular: Negative for chest pain and palpitations.  Musculoskeletal: Positive for gait problem.  Skin: Negative for rash.  Neurological: Positive for weakness (left side).    Objective:  BP 98/69   Pulse 77   Temp (!) 96.9 F (36.1 C) (Oral)   Ht 5' (1.524 m)   Wt 154 lb 9.6 oz (70.1 kg)   BMI 30.19 kg/m   BP Readings from Last 3 Encounters:    10/02/16 98/69  04/02/16 93/69  03/13/16 128/79    Wt Readings from Last 3 Encounters:  10/02/16 154 lb 9.6 oz (70.1 kg)  04/02/16 138 lb 3.2 oz (62.7 kg)  03/13/16 139 lb 6.4 oz (63.2 kg)     Physical Exam  Constitutional: She is oriented to person, place, and time. She appears well-developed and well-nourished. No distress.  HENT:  Head: Normocephalic and atraumatic.  Right Ear: External ear normal.  Left Ear: External ear normal.  Nose: Nose normal.  Mouth/Throat: Oropharynx is clear and moist.  Eyes: Conjunctivae and EOM are normal. Pupils are equal, round, and reactive to light.  Neck: Normal range of motion. Neck supple. No thyromegaly present.  Cardiovascular: Normal rate, regular rhythm and normal heart sounds.   No murmur heard. Pulmonary/Chest: Effort normal and breath sounds normal. No respiratory distress. She has no wheezes. She has no rales.  Abdominal: Soft. Bowel sounds are normal. She exhibits no distension. There is no tenderness.  Lymphadenopathy:    She has no cervical adenopathy.  Neurological: She is alert and oriented to person, place, and time. Coordination abnormal.  Skin: Skin is warm and dry.     Lab Results  Component Value Date   WBC 4.3 10/27/2015   HGB 11.0 (L) 10/27/2015   HCT 34.1 (L) 10/27/2015   PLT 186 10/27/2015   GLUCOSE 91 10/27/2015   CHOL 155 10/26/2015   TRIG 165 (H) 10/26/2015   HDL 32 (L) 10/26/2015   LDLCALC 90 10/26/2015   ALT 15 07/04/2010   AST 12 07/04/2010  NA 140 10/27/2015   K 3.7 10/27/2015   CL 106 10/27/2015   CREATININE 0.96 10/27/2015   BUN 7 10/27/2015   CO2 24 10/27/2015   TSH 0.987 10/27/2015   HGBA1C 4.9 01/31/2016    Ct Head Wo Contrast  Result Date: 10/25/2015 CLINICAL DATA:  RIGHT-sided facial swelling, stroke-like symptoms with RIGHT upper extremity weakness for 2 days. History of diabetes, hypertension and stroke. EXAM: CT HEAD WITHOUT CONTRAST CT MAXILLOFACIAL WITH CONTRAST TECHNIQUE:  Multidetector CT imaging of the head was performed using the standard protocol without IV contrast. CT imaging of the maxillofacial structures was performed with intravenous contrast. Multiplanar CT image reconstructions were also generated. CONTRAST:  24m OMNIPAQUE IOHEXOL 300 MG/ML  SOLN COMPARISON:  CT head June 29, 2015 MRI of the brain June 29, 2015 FINDINGS: CT HEAD FINDINGS No intraparenchymal hemorrhage, mass effect, midline shift or acute large vascular territory infarct. 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 Ventriculoperitoneal shunt via a RIGHT parietal burr hole, with distal tip terminating in LEFT frontal horn, unchanged. Mild to moderate ventriculomegaly, decreased from prior imaging, no CT findings of hydrocephalus. No abnormal extra-axial fluid collections. Basal cisterns are patent. No skull fracture. CT MAXILLOFACIAL FINDINGS RIGHT facial/ preauricular mild soft tissue swelling, reticulated fat with minimal inflammatory changes superficial lobe of the RIGHT parotid gland. No parotid masses, ductal dilatation or sialolith. No radiopaque foreign bodies. Ocular globes and orbital contents are normal. Paranasal sinuses and mastoid air cells are well aerated. RIGHT jugular bulb dehiscence. No destructive bony lesions. Included airway is patent. Ventriculoperitoneal shunt courses through RIGHT neck, visualized portion is contiguous. Possible cerebellar DVA use though, incompletely characterized. IMPRESSION: CT HEAD: No acute intracranial process. Slightly decreased ventriculomegaly, stable positioning of ventriculoperitoneal shunt. 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 CT MAXILLOFACIAL: Mild RIGHT facial/preauricular soft tissue swelling most compatible cellulitis without drainable fluid collection. Mild inflammation of the parotid gland is likely reactive, less likely parotiditis. Electronically Signed   By: CElon AlasM.D.   On: 10/25/2015 22:35   Mr Brain Wo Contrast  Result Date:  10/26/2015 CLINICAL DATA:  New onset RIGHT-sided weakness. History of ventriculoperitoneal shunt and stroke 4 months ago. History of diabetes, hypertension. EXAM: MRI HEAD WITHOUT CONTRAST TECHNIQUE: Multiplanar, multiecho pulse sequences of the brain and surrounding structures were obtained without intravenous contrast. COMPARISON:  CT head October 25, 2015 at 2133 hours and MRI of the brain June 30th 2016 and MRI of the brain July 03, 2010 FINDINGS: No reduced diffusion to suggest acute ischemia. No susceptibility artifact is suggests hemorrhage. Ventriculoperitoneal shunt via RIGHT parietal approach with distal tip in the frontal horn of LEFT lateral ventricle. Mild gliosis along the catheter tract, unchanged. Slightly decreased ventricle size from prior imaging, no hydrocephalus. Cystic RIGHT thalamic lacunar infarct which was acute on prior MRI. Minimal nonspecific white matter T2 hyperintensities. No midline shift, mass effect or mass lesions. Chronic trace holohemispheric subdural hematomas with intermediate signal. No new extra-axial fluid collection. Normal major intracranial vascular flow voids seen at the skull base. Included ocular globes and orbital contents are unremarkable. Paranasal sinuses and mastoid air cells are well aerated. No abnormal sellar expansion. No suspicious calvarial bone marrow signal. IMPRESSION: No acute intracranial process, specifically no acute ischemia. 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 RIGHT ventriculoperitoneal shunt in place, no hydrocephalus. 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 Electronically Signed   By: CElon AlasM.D.   On: 10/26/2015 00:27   Mr Cervical Spine Wo Contrast  Result Date: 10/26/2015 CLINICAL DATA:  Left-sided numbness. EXAM: MRI CERVICAL SPINE WITHOUT CONTRAST TECHNIQUE: Multiplanar, multisequence MR imaging of the cervical spine was performed. No intravenous contrast was administered. COMPARISON:  07/03/2010 FINDINGS:  No marrow signal abnormality suggestive of fracture, infection, or neoplasm. Chronic calvarial thickening and dural thickening in the posterior fossa. Related engorged appearance of the cervical venous plexus, giving an effaced appearance to the thecal sac and prominent cord size. When compared to prior, there is no indication of cord swelling and cord signal is normal. 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 This is stable since at least 2010. No notable degenerative change in the cervical spine. No stenosis or impingement. IMPRESSION: 1. No impingement or cord lesion to explain sensory deficit. 2. Chronic calvarial and dural thickening, presumably shunt related. Electronically Signed   By: Monte Fantasia M.D.   On: 10/26/2015 18:31   Ct Maxillofacial W/cm  Result Date: 10/25/2015 CLINICAL DATA:  RIGHT-sided facial swelling, stroke-like symptoms with RIGHT upper extremity weakness for 2 days. History of diabetes, hypertension and stroke. EXAM: CT HEAD WITHOUT CONTRAST CT MAXILLOFACIAL WITH CONTRAST TECHNIQUE: Multidetector CT imaging of the head was performed using the standard protocol without IV contrast. CT imaging of the maxillofacial structures was performed with intravenous contrast. Multiplanar CT image reconstructions were also generated. CONTRAST:  53m OMNIPAQUE IOHEXOL 300 MG/ML  SOLN COMPARISON:  CT head June 29, 2015 MRI of the brain June 29, 2015 FINDINGS: CT HEAD FINDINGS No intraparenchymal hemorrhage, mass effect, midline shift or acute large vascular territory infarct. 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 Ventriculoperitoneal shunt via a RIGHT parietal burr hole, with distal tip terminating in LEFT frontal horn, unchanged. Mild to moderate ventriculomegaly, decreased from prior imaging, no CT findings of hydrocephalus. No abnormal extra-axial fluid collections. Basal cisterns  are patent. No skull fracture. CT MAXILLOFACIAL FINDINGS RIGHT facial/ preauricular mild soft tissue swelling, reticulated fat with minimal inflammatory changes superficial lobe of the RIGHT parotid gland. No parotid masses, ductal dilatation or sialolith. No radiopaque foreign bodies. Ocular globes and orbital contents are normal. Paranasal sinuses and mastoid air cells are well aerated. RIGHT jugular bulb dehiscence. No destructive bony lesions. Included airway is patent. Ventriculoperitoneal shunt courses through RIGHT neck, visualized portion is contiguous. Possible cerebellar DVA use though, incompletely characterized. IMPRESSION: CT HEAD: No acute intracranial process. Slightly decreased ventriculomegaly, stable positioning of ventriculoperitoneal shunt. 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 CT MAXILLOFACIAL: Mild RIGHT facial/preauricular soft tissue swelling most compatible cellulitis without drainable fluid collection. Mild inflammation of the parotid gland is likely reactive, less likely parotiditis. Electronically Signed   By: CElon AlasM.D.   On: 10/25/2015 22:35   Dg Chest Portable 1 View  Result Date: 10/25/2015 CLINICAL DATA:  Swelling on the right side of the face for 3 days. VP shunt placed in 2000. History of stroke and left-sided weakness. EXAM: PORTABLE CHEST 1 VIEW COMPARISON:  Chest radiograph 07/02/2010 FINDINGS: Prominent interstitial lung markings bilaterally with mild peribronchial thickening. Mild fullness in the perihilar regions. Heart size is within normal limits. The trachea is midline. There is a right VP shunt. IMPRESSION: Prominent interstitial lung markings with perihilar fullness. Findings are concerning for mild pulmonary edema. Electronically Signed   By: AMarkus DaftM.D.   On: 10/25/2015 21:28    Assessment & Plan:   MSahanawas seen today for cough, nasal congestion, diarrhea and nausea.  Diagnoses and all orders for this visit:  COPD exacerbation (  [place]) -      CBC with Differential/Platelet -     CMP14+EGFR -     DG Chest 2 View; Future -     DG Abd 2 Views; Future  Gastroenteritis -     CBC with Differential/Platelet -     CMP14+EGFR -     DG Chest 2 View; Future -     DG Abd 2 Views; Future  Hemiparesis affecting left side as late effect of cerebrovascular accident (CVA) ([place])  Left-sided weakness  Encounter for immunization -     Flu Vaccine QUAD 36+ mos IM  Other orders -     diphenoxylate-atropine (LOMOTIL) 2.5-0.025 MG tablet; Take 2 tablets by mouth 4 (four) times daily as needed for diarrhea or loose stools. -     azithromycin (ZITHROMAX Z-PAK) 250 MG tablet; Take two right away Then one a day for the next 4 days.      I am having Ms. Reason start on diphenoxylate-atropine and azithromycin. I am also having her maintain her ALPRAZolam, diclofenac sodium, pregabalin, HYDROcodone-acetaminophen, QUEtiapine, topiramate, traZODone, levETIRAcetam, Fluticasone-Salmeterol, aspirin EC, cyanocobalamin, loratadine, omeprazole, FLUoxetine, fluticasone, cyclobenzaprine, VENTOLIN HFA, promethazine, and tiZANidine.  Meds ordered this encounter  Medications  . diphenoxylate-atropine (LOMOTIL) 2.5-0.025 MG tablet    Sig: Take 2 tablets by mouth 4 (four) times daily as needed for diarrhea or loose stools.    Dispense:  30 tablet    Refill:  0  . azithromycin (ZITHROMAX Z-PAK) 250 MG tablet    Sig: Take two right away Then one a day for the next 4 days.    Dispense:  6 each    Refill:  0     Follow-up: Return if symptoms worsen or fail to improve.  Claretta Fraise, M.D.

## 2016-10-03 NOTE — Progress Notes (Signed)
Your chest x-ray looked normal. Thanks, WS.

## 2016-10-10 ENCOUNTER — Other Ambulatory Visit: Payer: Self-pay | Admitting: Family Medicine

## 2016-10-10 ENCOUNTER — Ambulatory Visit: Payer: Medicare Other

## 2016-10-11 ENCOUNTER — Ambulatory Visit: Payer: Medicare Other | Admitting: Family Medicine

## 2016-10-14 ENCOUNTER — Ambulatory Visit (INDEPENDENT_AMBULATORY_CARE_PROVIDER_SITE_OTHER): Payer: Medicare Other | Admitting: Family Medicine

## 2016-10-14 ENCOUNTER — Encounter: Payer: Self-pay | Admitting: Family Medicine

## 2016-10-14 VITALS — BP 139/89 | HR 81 | Temp 97.9°F | Ht 60.0 in | Wt 152.2 lb

## 2016-10-14 DIAGNOSIS — S46912A Strain of unspecified muscle, fascia and tendon at shoulder and upper arm level, left arm, initial encounter: Secondary | ICD-10-CM | POA: Diagnosis not present

## 2016-10-14 DIAGNOSIS — I639 Cerebral infarction, unspecified: Secondary | ICD-10-CM

## 2016-10-14 MED ORDER — METHYLPREDNISOLONE ACETATE 80 MG/ML IJ SUSP
80.0000 mg | Freq: Once | INTRAMUSCULAR | Status: AC
  Administered 2016-10-14: 80 mg via INTRAMUSCULAR

## 2016-10-14 NOTE — Progress Notes (Signed)
BP 139/89   Pulse 81   Temp 97.9 F (36.6 C) (Oral)   Ht 5' (1.524 m)   Wt 152 lb 4 oz (69.1 kg)   BMI 29.73 kg/m    Subjective:    Patient ID: Kelli Miranda, female    DOB: 18-Sep-1976, 40 y.o.   MRN: [id number]  HPI: Kelli Miranda is a 40 y.o. female presenting on 10/14/2016 for Shoulder Pain (x 2 weeks, left shoulder, no known injury)   HPI Shoulder pain 2 weeks Patient comes in complaining of left shoulder pain that is been bothering her for 2 weeks. She feels like she might have sprained or strained it while lifting for something overhead. The pain is mostly on her lateral shoulder but does extend up into the musculature above her shoulders well. She denies any neck pain. She denies any numbness or weakness. He denies any loss of range motion. She denies any erythema or warmth.  Relevant past medical, surgical, family and social history reviewed and updated as indicated. Interim medical history since our last visit reviewed. Allergies and medications reviewed and updated.  Review of Systems  Constitutional: Negative for chills and fever.  HENT: Negative for congestion, ear discharge and ear pain.   Eyes: Negative for redness and visual disturbance.  Respiratory: Negative for chest tightness and shortness of breath.   Cardiovascular: Negative for chest pain and leg swelling.  Genitourinary: Negative for difficulty urinating and dysuria.  Musculoskeletal: Positive for arthralgias. Negative for back pain, gait problem, neck pain and neck stiffness.  Skin: Negative for rash.  Neurological: Negative for light-headedness and headaches.  Psychiatric/Behavioral: Negative for agitation and behavioral problems.  All other systems reviewed and are negative.   Per HPI unless specifically indicated above     Objective:    BP 139/89   Pulse 81   Temp 97.9 F (36.6 C) (Oral)   Ht 5' (1.524 m)   Wt 152 lb 4 oz (69.1 kg)   BMI 29.73 kg/m   Wt Readings from Last 3  Encounters:  10/14/16 152 lb 4 oz (69.1 kg)  10/02/16 154 lb 9.6 oz (70.1 kg)  04/02/16 138 lb 3.2 oz (62.7 kg)    Physical Exam  Constitutional: She is oriented to person, place, and time. She appears well-developed and well-nourished. No distress.  Eyes: Conjunctivae are normal.  Cardiovascular: Normal rate, regular rhythm, normal heart sounds and intact distal pulses.   No murmur heard. Pulmonary/Chest: Effort normal and breath sounds normal. No respiratory distress. She has no wheezes.  Musculoskeletal: Normal range of motion. She exhibits no edema.       Left shoulder: She exhibits tenderness (Tenderness over lateral musculature and slightly over the musculature between the neck and the shoulder.). She exhibits normal range of motion (Negative impingement syndrome and no pain with external rotation, negative empty can test.), no bony tenderness, no effusion, no crepitus, no deformity and normal strength.  Neurological: She is alert and oriented to person, place, and time. Coordination normal.  Skin: Skin is warm and dry. No rash noted. She is not diaphoretic.  Psychiatric: She has a normal mood and affect. Her behavior is normal.  Nursing note and vitals reviewed.     Assessment & Plan:   Problem List Items Addressed This Visit    None    Visit Diagnoses    Left shoulder strain, initial encounter    -  Primary   Likely muscular strain about 2 weeks ago she felt like  she was lifting up overhead and strained it. We'll do Depo injection and return as needed   Relevant Medications   methylPREDNISolone acetate (DEPO-MEDROL) injection 80 mg (Start on 10/14/2016  3:15 PM)       Follow up plan: Return if symptoms worsen or fail to improve.  Counseling provided for all of the vaccine components No orders of the defined types were placed in this encounter.   Arville Care, MD [facility] 10/14/2016, 3:06 PM

## 2016-11-07 ENCOUNTER — Other Ambulatory Visit: Payer: Self-pay | Admitting: Family Medicine

## 2016-11-25 ENCOUNTER — Other Ambulatory Visit: Payer: Self-pay | Admitting: Family Medicine

## 2016-12-19 ENCOUNTER — Other Ambulatory Visit: Payer: Self-pay | Admitting: Family Medicine

## 2016-12-20 ENCOUNTER — Other Ambulatory Visit: Payer: Self-pay | Admitting: Nurse Practitioner

## 2016-12-20 ENCOUNTER — Other Ambulatory Visit: Payer: Self-pay | Admitting: Family Medicine

## 2017-01-13 ENCOUNTER — Other Ambulatory Visit: Payer: Self-pay | Admitting: Family Medicine

## 2017-01-14 ENCOUNTER — Other Ambulatory Visit: Payer: Self-pay | Admitting: Family Medicine

## 2017-01-17 ENCOUNTER — Other Ambulatory Visit: Payer: Self-pay | Admitting: Family Medicine

## 2017-03-28 ENCOUNTER — Ambulatory Visit (INDEPENDENT_AMBULATORY_CARE_PROVIDER_SITE_OTHER): Payer: Medicare Other | Admitting: Family Medicine

## 2017-03-28 DIAGNOSIS — M7062 Trochanteric bursitis, left hip: Secondary | ICD-10-CM

## 2017-03-28 DIAGNOSIS — M7581 Other shoulder lesions, right shoulder: Secondary | ICD-10-CM

## 2017-03-28 DIAGNOSIS — M7061 Trochanteric bursitis, right hip: Secondary | ICD-10-CM

## 2017-03-28 DIAGNOSIS — M7551 Bursitis of right shoulder: Secondary | ICD-10-CM | POA: Diagnosis not present

## 2017-09-30 IMAGING — MR MR CERVICAL SPINE W/O CM
4 of 5 series · 23 of 48 positions shown · non-contrast
Comparison: 07/03/2010

CLINICAL DATA: Left-sided numbness.

EXAM:
MRI CERVICAL SPINE WITHOUT CONTRAST
TECHNIQUE: Multiplanar, multisequence MR imaging of the cervical spine was
performed. No intravenous contrast was administered.

[Series 2: T2 · sagittal · 3.0mm · 0.86mm/px · 6 of 13 slices shown (1 of 2)]
[im 1/13]
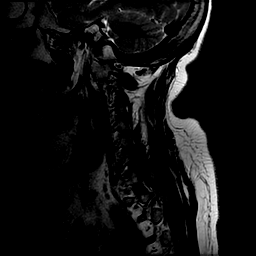
[im 3/13]
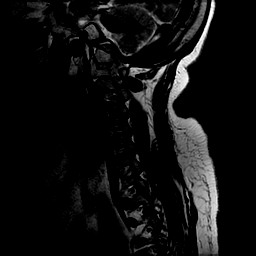
[im 5/13]
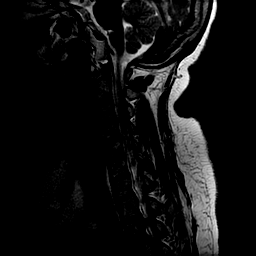
[im 8/13]
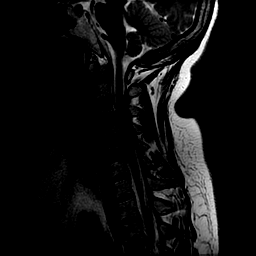
[im 10/13]
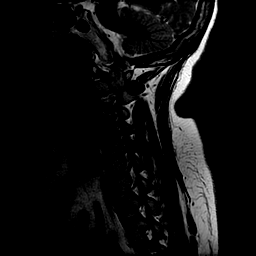
[im 13/13]
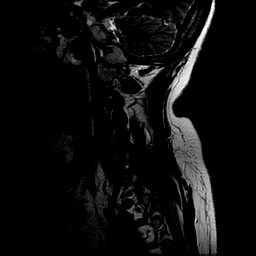

[Series 3: T1 · sagittal · 3.0mm · 0.43mm/px · 5 of 13 slices shown]
[im 1/13]
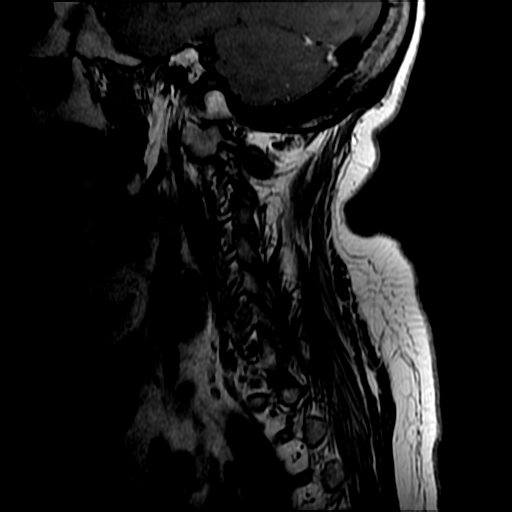
[im 3/13]
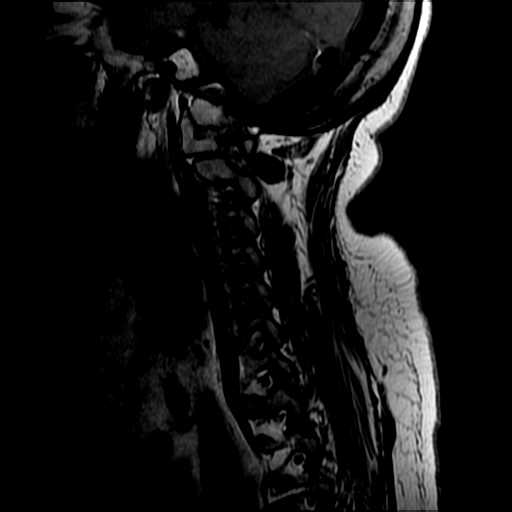
[im 5/13]
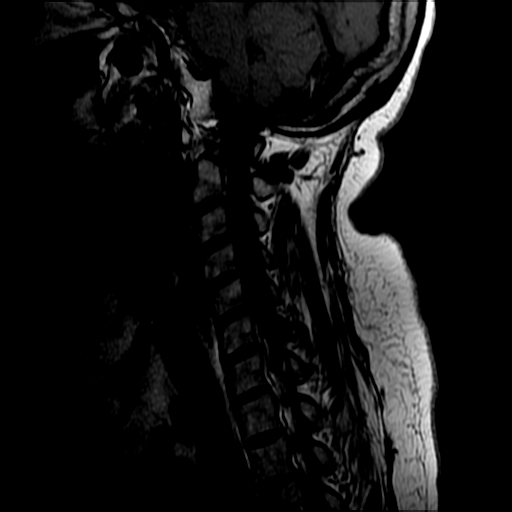
[im 8/13]
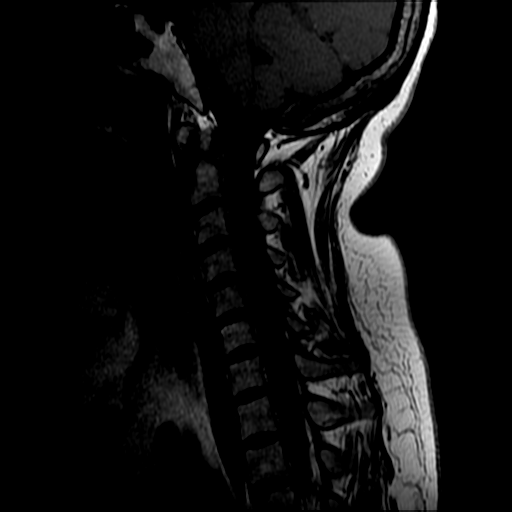
[im 13/13]
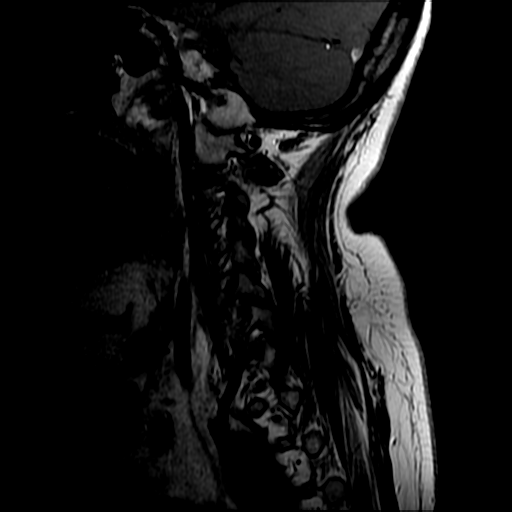

[Series 4: sag ir · sagittal · 3.0mm · 0.43mm/px · 3 of 13 slices shown]
[im 3/13]
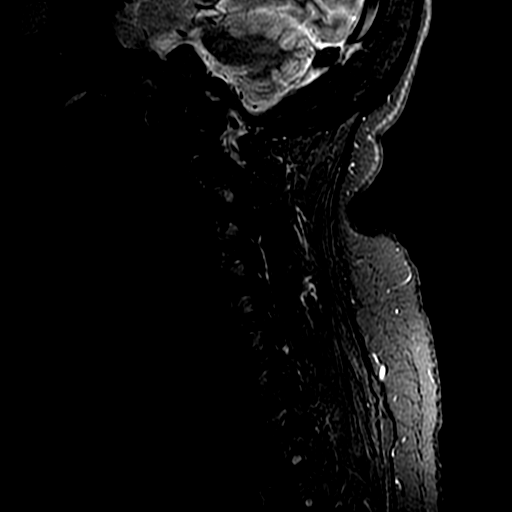
[im 8/13]
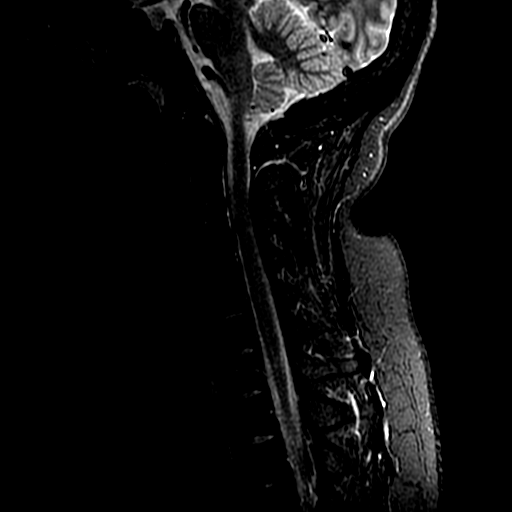
[im 13/13]
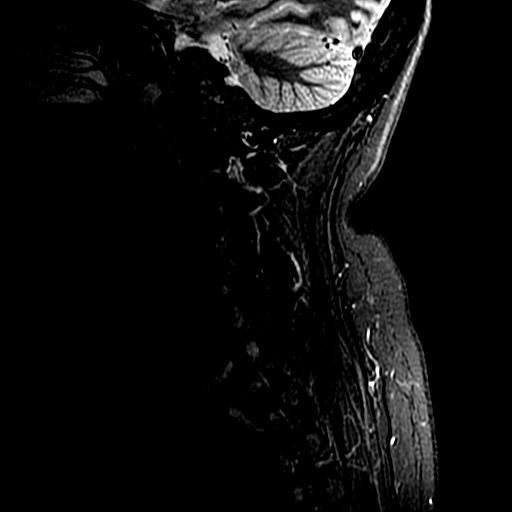

[Series 5: T2 · axial · 3.0mm · 0.39mm/px · z∈[-62,+36]mm · 9 of 32 slices shown (2 of 2)]
[im 1/32]
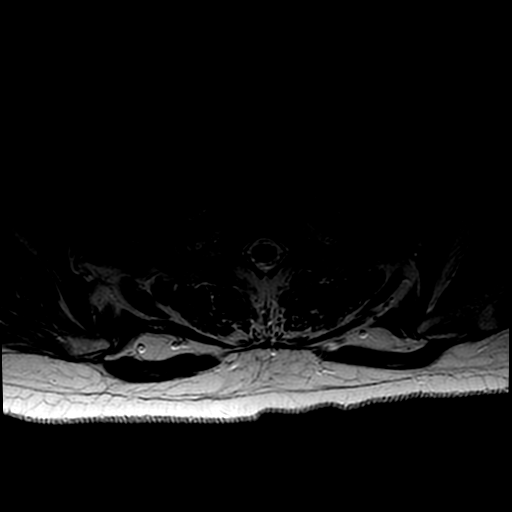
[im 5/32]
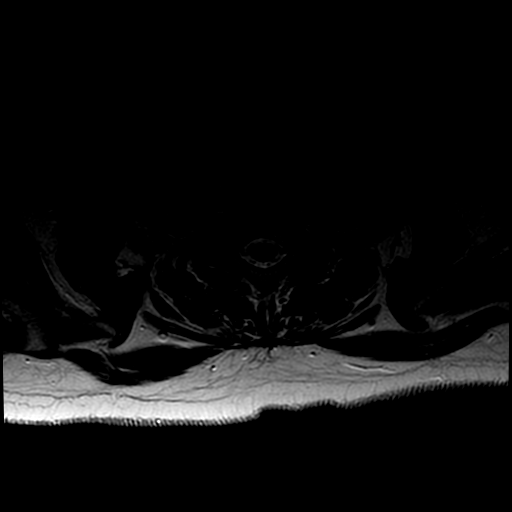
[im 9/32]
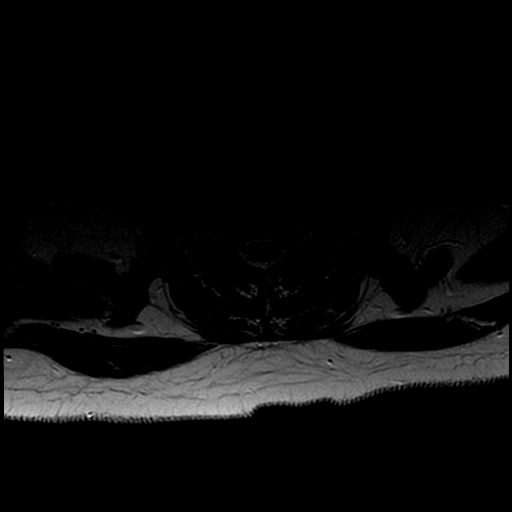
[im 14/32]
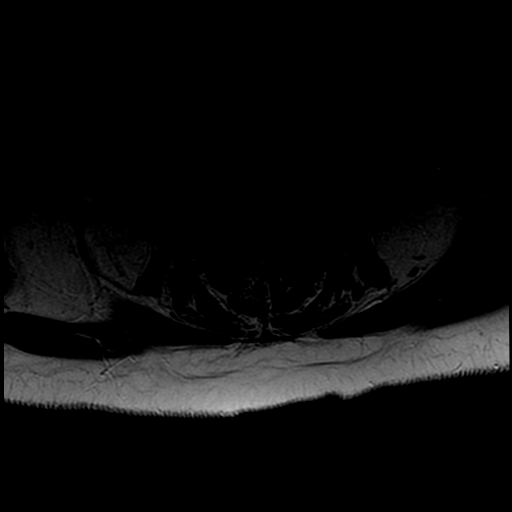
[im 16/32]
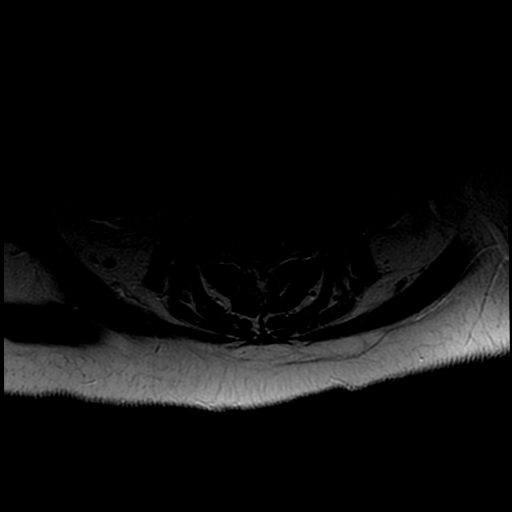
[im 18/32]
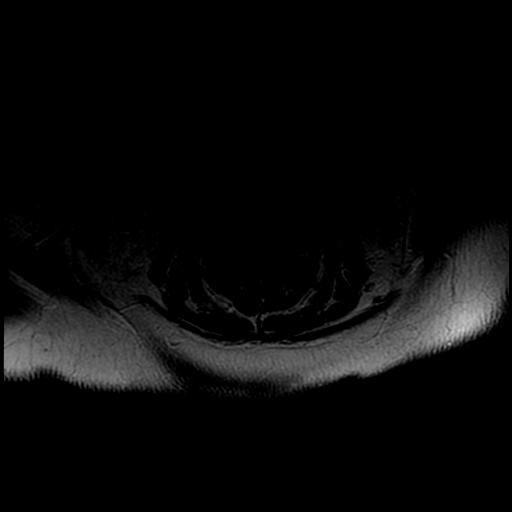
[im 23/32]
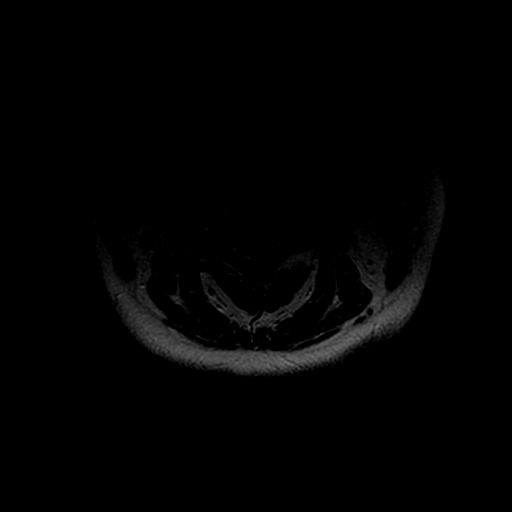
[im 27/32]
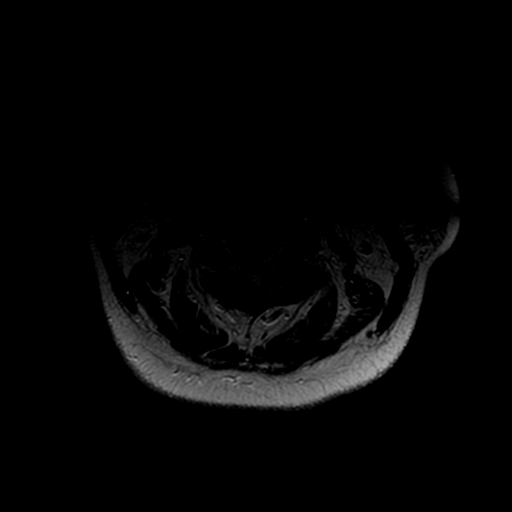
[im 32/32]
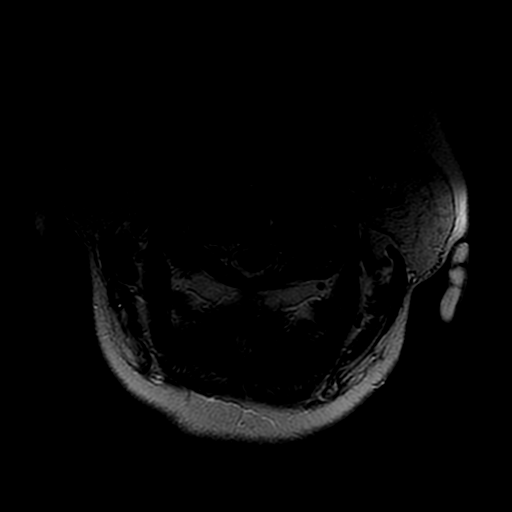

[23 of 48 positions shown; findings below may reference images not displayed]

FINDINGS: No marrow signal abnormality suggestive of fracture, infection, or
neoplasm.

Chronic calvarial thickening and dural thickening in the posterior
fossa. Related engorged appearance of the cervical venous plexus,
giving an effaced appearance to the thecal sac and prominent cord
size. When compared to prior, there is no indication of cord
swelling and cord signal is normal.

Chronic prominent pituitary gland measuring 11 mm in extending out
of the shallow sella, potentially congestion given the calvarial and
dural findings. This is stable since at least 5434.

No notable degenerative change in the cervical spine. No stenosis or
impingement.
IMPRESSION: 1. No impingement or cord lesion to explain sensory deficit.
2. Chronic calvarial and dural thickening, presumably shunt related.

## 2017-11-13 ENCOUNTER — Other Ambulatory Visit: Payer: Self-pay | Admitting: Neurosurgery

## 2017-11-13 ENCOUNTER — Ambulatory Visit
Admission: RE | Admit: 2017-11-13 | Discharge: 2017-11-13 | Disposition: A | Discharge status: Home / Self Care | Payer: Medicare Other | Source: Ambulatory Visit | Attending: Neurosurgery | Admitting: Neurosurgery

## 2017-11-13 DIAGNOSIS — G911 Obstructive hydrocephalus: Secondary | ICD-10-CM

## 2017-11-28 ENCOUNTER — Other Ambulatory Visit: Payer: Self-pay | Admitting: Neurosurgery

## 2017-12-09 ENCOUNTER — Other Ambulatory Visit: Payer: Self-pay | Admitting: Family Medicine

## 2017-12-09 DIAGNOSIS — J441 Chronic obstructive pulmonary disease with (acute) exacerbation: Secondary | ICD-10-CM

## 2017-12-26 NOTE — Pre-Procedure Instructions (Signed)
Kelli Miranda  12/26/2017      [id number] [place], [place] - [name] [place] [address] Phone: [phone] Fax: [phone]  [facility] - [place], [place] - [address] [place] [address] Phone: [phone] Fax: [phone]  [facility] - [place] - [address] [place] [address] Phone: [phone] Fax: [phone]    Your procedure is scheduled on Friday, January 02, 2018  [facility] "A" at 12:45PM   Call this number if you have problems the morning of surgery:  [phone]   Remember:  Do not eat food or drink liquids after midnight.  Take these medicines the morning of surgery with A SIP OF WATER: FLUoxetine (PROZAC), LevETIRAcetam (KEPPRA), Loratadine (CLARITIN), Omeprazole (PRILOSEC), Pregabalin (LYRICA), and Topiramate (TOPAMAX). If needed ALPRAZolam Prudy Feeler(XANAX) for anxiety, HYDROcodone-acetaminophen (NORCO) for pain, Promethazine (PHENERGAN) for nausea, Fluticasone (FLONASE) for congestion, and VENTOLIN HFA Inhaler for cough or wheezing (Bring with you the day of surgery).  Follow your doctor's instruction regarding Aspirin.  As of today, stop taking all Aspirins, Vitamins, Fish oils, and Herbal medications. Also stop all NSAIDS i.e. Advil, Ibuprofen, Motrin, Aleve, Anaprox, Naproxen, BC and Goody Powders.  How to Manage Your Diabetes Before and After Surgery  Why is it important to control my blood sugar before and after surgery? . Improving blood sugar levels before and after surgery helps healing and can limit problems. . A way of improving blood sugar control is eating a healthy diet by: o  Eating less sugar and carbohydrates o  Increasing activity/exercise o  Talking with your doctor about reaching your blood sugar goals . High blood sugars (greater than 180  mg/dL) can raise your risk of infections and slow your recovery, so you will need to focus on controlling your diabetes during the weeks before surgery. . Make sure that the doctor who takes care of your diabetes knows about your planned surgery including the date and location.  How do I manage my blood sugar before surgery? . Check your blood sugar at least 4 times a day, starting 2 days before surgery, to make sure that the level is not too high or low. o Check your blood sugar the morning of your surgery when you wake up and every 2 hours until you get to the Short Stay unit. . If your blood sugar is less than 70 mg/dL, you will need to treat for low blood sugar: o Do not take insulin. o Treat a low blood sugar (less than 70 mg/dL) with  cup of clear juice (cranberry or apple), 4 glucose tablets, OR glucose gel. Recheck blood sugar in 15 minutes after treatment (to make sure it is greater than 70 mg/dL). If your blood sugar is not greater than 70 mg/dL on recheck, call [phone] o  for further instructions. . Report your blood sugar to the short stay nurse when you get to Short Stay.  . If you are admitted to the hospital after surgery: o Your blood sugar will be checked by the staff and you will probably be given insulin after surgery (instead of oral diabetes medicines) to make sure you have good blood sugar levels. o The goal for blood sugar control after surgery is 80-180 mg/dL.  . If your CBG is greater than 220 mg/dL, call us at [phone]   Do not  wear jewelry, make-up or nail polish.  Do not wear lotions, powders, perfumes, or deodorant.  Do not shave 48 hours prior to surgery.   Do not bring valuables to the hospital.  [facility] is [facility] for any belongings or valuables.  Contacts, dentures or bridgework may not be worn into surgery.  Leave your suitcase in the car.  After surgery it may be brought to your room.  For patients admitted to the hospital, discharge  time will be determined by your treatment team.  Patients discharged the day of surgery will not be allowed to drive home.   Special instructions:   [name]- Preparing For Surgery  Before surgery, you can play an important role. Because skin is not sterile, your skin needs to be as free of germs as possible. You can reduce the number of germs on your skin by washing with CHG (chlorahexidine gluconate) Soap before surgery.  CHG is an antiseptic cleaner which kills germs and bonds with the skin to continue killing germs even after washing.  Please do not use if you have an allergy to CHG or antibacterial soaps. If your skin becomes reddened/irritated stop using the CHG.  Do not shave (including legs and underarms) for at least 48 hours prior to first CHG shower. It is OK to shave your face.  Please follow these instructions carefully.   1. Shower the NIGHT BEFORE SURGERY and the MORNING OF SURGERY with CHG.   2. If you chose to wash your hair, wash your hair first as usual with your normal shampoo.  3. After you shampoo, rinse your hair and body thoroughly to remove the shampoo.  4. Use CHG as you would any other liquid soap. You can apply CHG directly to the skin and wash gently with a scrungie or a clean washcloth.   5. Apply the CHG Soap to your body ONLY FROM THE NECK DOWN.  Do not use on open wounds or open sores. Avoid contact with your eyes, ears, mouth and genitals (private parts). Wash Face and genitals (private parts)  with your normal soap.  6. Wash thoroughly, paying special attention to the area where your surgery will be performed.  7. Thoroughly rinse your body with warm water from the neck down.  8. DO NOT shower/wash with your normal soap after using and rinsing off the CHG Soap.  9. Pat yourself dry with a CLEAN TOWEL.  10. Wear CLEAN PAJAMAS to bed the night before surgery, wear comfortable clothes the morning of surgery  11. Place CLEAN SHEETS on your bed the  night of your first shower and DO NOT SLEEP WITH PETS.  Day of Surgery: Do not apply any deodorants/lotions. Please wear clean clothes to the hospital/surgery center.    Please read over the following fact sheets that you were given. Pain Booklet, Coughing and Deep Breathing and Surgical Site Infection Prevention

## 2017-12-29 ENCOUNTER — Encounter (HOSPITAL_COMMUNITY)
Admission: RE | Admit: 2017-12-29 | Discharge: 2017-12-29 | Disposition: A | Discharge status: Home / Self Care | Payer: Medicare Other | Source: Ambulatory Visit | Attending: Neurosurgery | Admitting: Neurosurgery

## 2017-12-29 ENCOUNTER — Encounter (HOSPITAL_COMMUNITY): Payer: Self-pay

## 2017-12-29 ENCOUNTER — Other Ambulatory Visit: Payer: Self-pay

## 2017-12-29 DIAGNOSIS — M1990 Unspecified osteoarthritis, unspecified site: Secondary | ICD-10-CM | POA: Diagnosis not present

## 2017-12-29 DIAGNOSIS — Z01812 Encounter for preprocedural laboratory examination: Secondary | ICD-10-CM | POA: Diagnosis not present

## 2017-12-29 DIAGNOSIS — R569 Unspecified convulsions: Secondary | ICD-10-CM | POA: Diagnosis not present

## 2017-12-29 DIAGNOSIS — Z79899 Other long term (current) drug therapy: Secondary | ICD-10-CM | POA: Diagnosis not present

## 2017-12-29 DIAGNOSIS — Z9049 Acquired absence of other specified parts of digestive tract: Secondary | ICD-10-CM | POA: Diagnosis not present

## 2017-12-29 DIAGNOSIS — J449 Chronic obstructive pulmonary disease, unspecified: Secondary | ICD-10-CM | POA: Insufficient documentation

## 2017-12-29 DIAGNOSIS — Z90710 Acquired absence of both cervix and uterus: Secondary | ICD-10-CM | POA: Diagnosis not present

## 2017-12-29 DIAGNOSIS — I10 Essential (primary) hypertension: Secondary | ICD-10-CM | POA: Insufficient documentation

## 2017-12-29 DIAGNOSIS — F319 Bipolar disorder, unspecified: Secondary | ICD-10-CM | POA: Insufficient documentation

## 2017-12-29 DIAGNOSIS — G911 Obstructive hydrocephalus: Secondary | ICD-10-CM | POA: Insufficient documentation

## 2017-12-29 DIAGNOSIS — E119 Type 2 diabetes mellitus without complications: Secondary | ICD-10-CM | POA: Diagnosis not present

## 2017-12-29 DIAGNOSIS — I69854 Hemiplegia and hemiparesis following other cerebrovascular disease affecting left non-dominant side: Secondary | ICD-10-CM | POA: Diagnosis not present

## 2017-12-29 DIAGNOSIS — G43909 Migraine, unspecified, not intractable, without status migrainosus: Secondary | ICD-10-CM | POA: Diagnosis not present

## 2017-12-29 HISTORY — DX: Migraine, unspecified, not intractable, without status migrainosus: G43.909

## 2017-12-29 HISTORY — DX: Headache, unspecified: R51.9

## 2017-12-29 HISTORY — DX: Obstructive hydrocephalus: G91.1

## 2017-12-29 HISTORY — DX: Unspecified convulsions: R56.9

## 2017-12-29 HISTORY — DX: Hemiplegia, unspecified affecting unspecified side: G81.90

## 2017-12-29 HISTORY — DX: Headache: R51

## 2017-12-29 HISTORY — DX: Other injury of unspecified body region, initial encounter: T14.8XXA

## 2017-12-29 HISTORY — DX: Bipolar disorder, unspecified: F31.9

## 2017-12-29 HISTORY — DX: Prediabetes: R73.03

## 2017-12-29 HISTORY — DX: Unspecified osteoarthritis, unspecified site: M19.90

## 2017-12-29 LAB — CBC
HEMATOCRIT: 38.3 % (ref 36.0–46.0)
HEMOGLOBIN: 13.2 g/dL (ref 12.0–15.0)
MCH: 31.1 pg (ref 26.0–34.0)
MCHC: 34.5 g/dL (ref 30.0–36.0)
MCV: 90.3 fL (ref 78.0–100.0)
Platelets: 274 10*3/uL (ref 150–400)
RBC: 4.24 MIL/uL (ref 3.87–5.11)
RDW: 14.1 % (ref 11.5–15.5)
WBC: 8.3 10*3/uL (ref 4.0–10.5)

## 2017-12-29 LAB — BASIC METABOLIC PANEL
ANION GAP: 13 (ref 5–15)
BUN: 13 mg/dL (ref 6–20)
CO2: 20 mmol/L — ABNORMAL LOW (ref 22–32)
Calcium: 9 mg/dL (ref 8.9–10.3)
Chloride: 101 mmol/L (ref 101–111)
Creatinine, Ser: 1 mg/dL (ref 0.44–1.00)
GFR calc Af Amer: >60 mL/min (ref >60)
GLUCOSE: 99 mg/dL (ref 65–99)
POTASSIUM: 3.7 mmol/L (ref 3.5–5.1)
Sodium: 134 mmol/L — ABNORMAL LOW (ref 135–145)

## 2017-12-29 NOTE — Progress Notes (Signed)
Kelli Miranda            12/26/2017                          [id number] [place], [place] - [address] [place] [address] Phone: [phone] Fax: [phone]  [facility] - [place], [place] - [address] [place] [address] Phone: [phone] Fax: [phone]  [facility] - [place] - [address] [place] [address] Phone: [phone] Fax: [phone]              Your procedure is scheduled on Friday, January 02, 2018            [facility] at 12:45PM             Call this number if you have problems the morning of surgery:            [phone]             Remember:            Do not eat food or drink liquids after midnight.            Take these medicines the morning of surgery with A SIP OF WATER: FLUoxetine (PROZAC), LevETIRAcetam (KEPPRA), Loratadine (CLARITIN), Omeprazole (PRILOSEC), Pregabalin (LYRICA), and Topiramate (TOPAMAX). If needed ALPRAZolam Prudy Feeler(XANAX) for anxiety, HYDROcodone-acetaminophen (NORCO) for pain, Promethazine (PHENERGAN) for nausea, Fluticasone (FLONASE) for congestion, and VENTOLIN HFA Inhaler for cough or wheezing (Bring with you the day of surgery).  Follow your doctor's instruction regarding Aspirin.  As of today, stop taking all Aspirins, Vitamins, Fish oils, and Herbal medications. Also stop all NSAIDS i.e. Advil, Ibuprofen, Motrin, Aleve, Anaprox, Naproxen, BC and Goody Powders.             Do not wear jewelry, make-up or nail polish.            Do not wear lotions, powders, perfumes, or deodorant.            Do not shave 48 hours prior to surgery.             Do not bring valuables to the hospital.            [facility] is not responsible for any belongings or valuables.  Contacts, dentures or bridgework may not be worn into surgery.   Leave your suitcase in the car.  After surgery it may be brought to your room.  For patients admitted to the hospital, discharge time will be determined by your treatment team.  Patients discharged the day of surgery will not be allowed to drive home.   Special instructions:   [place]- Preparing For Surgery  Before surgery, you can play an important role. Because skin is not sterile, your skin needs to be as free of germs as possible. You can reduce the number of germs on your skin by washing with CHG (chlorahexidine gluconate) Soap before surgery.  CHG is an antiseptic cleaner which kills germs and bonds with the skin to continue killing germs even after washing.  Please do not use if you have an allergy to CHG or antibacterial soaps. If your skin becomes reddened/irritated stop using the CHG.  Do not shave (including legs and underarms) for at least 48 hours prior to first  CHG shower. It is OK to shave your face.  Please follow these instructions carefully.                                                                                                                     1. Shower the NIGHT BEFORE SURGERY and the MORNING OF SURGERY with CHG.   2. If you chose to wash your hair, wash your hair first as usual with your normal shampoo.  3. After you shampoo, rinse your hair and body thoroughly to remove the shampoo.  4. Use CHG as you would any other liquid soap. You can apply CHG directly to the skin and wash gently with a scrungie or a clean washcloth.   5. Apply the CHG Soap to your body ONLY FROM THE NECK DOWN.  Do not use on open wounds or open sores. Avoid contact with your eyes, ears, mouth and genitals (private parts). Wash Face and genitals (private parts)  with your normal soap.  6. Wash thoroughly, paying special attention to the area where your surgery will be performed.  7. Thoroughly rinse your body with warm water from the neck down.  8. DO NOT shower/wash  with your normal soap after using and rinsing off the CHG Soap.  9. Pat yourself dry with a CLEAN TOWEL.  10. Wear CLEAN PAJAMAS to bed the night before surgery, wear comfortable clothes the morning of surgery  11. Place CLEAN SHEETS on your bed the night of your first shower and DO NOT SLEEP WITH PETS.  Day of Surgery: Do not apply any deodorants/lotions. Please wear clean clothes to the hospital/surgery center.    Please read over the following fact sheets that you were given. Pain Booklet, Coughing and Deep Breathing and Surgical Site Infection Prevention

## 2017-12-29 NOTE — Progress Notes (Signed)
PCP - Dr. Samuel Jesterynthia Butler  Cardiologist - Denies  Chest x-ray - 09/29/17 (CE)- Requested  EKG - 09/30/17 (CE)- Requested- DOS if not received  Stress Test - Denies  ECHO - 10/26/15 (E)  Cardiac Cath - Denies  Sleep Study - Yes- Negative CPAP - None  LABS- 12/29/17: CBC, BMP   Anesthesia- Yes- Requested records  Pt denies having chest pain, sob, or fever at this time. All instructions explained to the pt, with a verbal understanding of the material. Pt agrees to go over the instructions while at home for a better understanding. The opportunity to ask questions was provided.

## 2017-12-31 NOTE — Progress Notes (Signed)
Anesthesia Chart Review: Patient is a 42 year old female scheduled for shunt revision with programmable valve on 01/02/2018 by Dr. Maeola HarmanJoseph Stern.  History includes smoking, CVA with left hemiparesis, obstructive hydrocephalus (s/p VP shunt), seizures, HTN, COPD, borderline DM, Bipolar disorder, migraines, arthritis, cholecystectomy, hysterectomy.  PCP is listed as Dr. Samuel Jesterynthia Butler.   Meds include Xanax, Advair, Norco, hydroxyzine, Keppra, KCl, Lyrica, promethazine when necessary, Seroquel, Topamax, trazodone, Ventolin HFA when necessary.  BP 108/80   Pulse 80   Temp 36.7 C   Resp 18   Ht 5' 1.5" (1.562 m)   Wt 137 lb 1.6 oz (62.2 kg)   SpO2 99%   BMI 25.49 kg/m   EKG 09/30/17 [facility]):  Ventricular Rate 98BPM  Atrial Rate98BPM  P-R Interval 168 ms QRS Duration 78ms Q-T Interval 394 ms QTC503 ms P Axis 48degrees  R Axis 8 degrees  T Axis 22degrees Sinus rhythm Prolonged QT interval, consider electrolyte imbalance or drug effects When compared with ECG of 05-Aug-2017 15:00, No significant change was found Confirmed by SIMMONS, DR. T. W. ([id number]) on 09/30/2017 7:15:29 AM  (Previous QT 340 ms, QTc 464 ms 08/05/17; QT 432, QTc 498 12/15/14)   Echo 10/26/15: Study Conclusions - Left ventricle: The cavity size was normal. Wall thickness was   normal. Systolic function was normal. The estimated ejection   fraction was in the range of 55% to 60%. Although no diagnostic   regional wall motion abnormality was identified, this possibility   cannot be completely excluded on the basis  of this study. Left   ventricular diastolic function parameters were normal. - Aortic valve: There was no stenosis. - Mitral valve: There was no significant regurgitation. - Right ventricle: The cavity size was normal. Systolic function   was normal. - Pulmonary arteries: No complete TR doppler jet so unable to   estimate PA systolic pressure. - Inferior vena cava: The vessel was normal in size. The   respirophasic diameter changes were in the normal range (>= 50%),   consistent with normal central venous pressure. Impressions: - Normal LV size with EF 55-60%. Normal RV size and systolic   function. No significant valvular abnormalities.  CTA head/neck 06/29/15 [facility] Every[facility]): Result Impression: 1.Sequela of recent right thalamic infarct without CT evidence of new ischemia or hemorrhage.  2.No CTA evidence of a significant vascular abnormality. Scattered atherosclerotic calcification without hemodynamically significant vessel stenosis.  3.Similar size and configuration of the ventricular system relative to the CT exam dated 06/28/2015 with a right parietal approach ventricular shunt catheter in similar position.  PCXR 09/29/17 [facility] Every[facility]): Result Impression: Triangular density in the right upper lobe suspicious for atelectasis and/or fluid in the fissure.  Hazy airspace density in the right lung base.  Preoperative labs noted.   She has had at least intermittent prolonged QT on EKGs dating back to 2015. Last tracing in 09/2017, so defer decision for repeat EKG to anesthesiologist.   Kelli ChockAllison Kaneesha Constantino, PA-C [facility] Short Stay [facility]/Anesthesiology Phone [phone] 12/31/2017 3:19 PM

## 2018-01-01 NOTE — H&P (Signed)
Patient ID:   [id number] Patient: Kelli InglesMargaret Petroni  Date of Birth: 02/28/1976 Visit Type: Office Visit   Date: 11/17/2017 12:00 PM Provider: Danae OrleansJoseph D. Venetia MaxonStern MD   This 42 year old female presents for Headache.   History of Present Illness: 1.  Headache  Patient returns to review her CT. She notes worsening headaches. She grades her headache 8.5/10.  CT 11/13/17: Slit-like ventricles suggestive of over shunting. Shunt catheter unchanged. Minimal bilateral subdural hygroma.         Medical/Surgical/Interim History Reviewed, no change.  Last detailed document date:11/12/2017.     Family History: Reviewed, no changes.  Last detailed document date:04/25/2014.   Social History: Reviewed, no changes. Last detailed document date: 04/25/2014.    MEDICATIONS(added, continued or stopped this visit): Started Medication Directions Instruction Stopped   Advair Diskus 250 mcg-50 mcg/dose powder for inhalation inhale 1 puff by inhalation route 2 times every day in the morning and evening approximately 12 hours apart  11/17/2017   alprazolam 0.5 mg tablet take 1 tablet by oral route 4 times every day     alprazolam 1 mg tablet take 1 tablet by oral route 5 times every day  11/17/2017   hydrocodone 5 mg-acetaminophen 325 mg tablet take 1 tablet by oral route  every 6 hours as needed for pain  11/17/2017   Klor-Con 10 mEq tablet,extended release take 1 tablet by oral route 2 times every day     levetiracetam 500 mg tablet take 1 tablet by oral route 2 times every day     ProAir HFA 90 mcg/actuation aerosol inhaler inhale 2 puff by inhalation route  every 4 - 6 hours as needed  11/17/2017   promethazine 25 mg tablet take 1 tablet by oral route  every 6 hours as needed  11/17/2017   promethazine 25 mg tablet take 1 tablet by oral route  every 8 hours as needed     quetiapine 100 mg tablet take 3 tablet by oral route  every bedtime     quetiapine 100 mg tablet take 1 tablet by oral route 2  times every day  11/17/2017   simvastatin 20 mg tablet take 1 tablet by oral route  every day in the evening  11/17/2017   Topamax 100 mg tablet take 1 tablet by oral route 3 times every day  11/17/2017   Topamax 200 mg tablet      trazodone 100 mg tablet take 1 tablet by oral route 2 times every     trazodone 100 mg tablet take 1 tablet by oral route 3 times every bedtime  11/17/2017     ALLERGIES: Ingredient Reaction Medication Name Comment  LATEX Rash        Vitals Date Temp F BP Pulse Ht In Wt Lb BMI BSA Pain Score  11/17/2017  116/77 103 61 133 25.13  8/10      IMPRESSION Patient returns to review CT with worsening headaches. CT shows shunt is over draining. We discussed the risk of infection. Patient is still interested in surgery. Patient will be scheduled for shunt revision for an adjustable shunt. Patient may need shunt replacement.  Patient will be scheduled for shunt revision for an adjustable shunt in January   To help address the headaches which are likely related  to over drainage.              Provider:  Venetia MaxonStern MD, Danae OrleansJoseph D 11/17/2017 1:14 PM  Dictation edited by: Lajoyce LauberLisa Hoyt    CC  Providers: Colon Branch MD  [address] [place], [place] [address]              Electronically signed by Danae Orleans. Venetia Maxon MD on 11/23/2017 02:00 PM  Patient ID:   [id number] Patient: Kelli Miranda  Date of Birth: October 01, 1976 Visit Type: Office Visit   Date: 11/12/2017 12:15 PM Provider: Danae Orleans. Venetia Maxon MD   This 42 year old female presents for Headache.   History of Present Illness: 1.  Headache  Patient visits reporting increased headaches x2 months with occasional neck pain relieved by "popping" her neck.  She was last seen in March.  In April, her daughter called reporting stroke symptoms and she was referred to the emergency department.  She did visit the emergency department at [facility] and was diagnosed with a new stroke.  She has  apparently had subsequent CT and MRI scans, neither of which are available today.           MEDICATIONS(added, continued or stopped this visit): Started Medication Directions Instruction Stopped   Advair Diskus 250 mcg-50 mcg/dose powder for inhalation inhale 1 puff by inhalation route 2 times every day in the morning and evening approximately 12 hours apart     alprazolam 1 mg tablet take 1 tablet by oral route 5 times every day     alprazolam 1 mg tablet take 1 tablet by oral route 3 times every day  11/12/2017   cetirizine 10 mg tablet take 1 tablet by oral route  every day  11/12/2017   gabapentin 300 mg capsule take 1 capsule by oral route 3 times every day  11/12/2017   hydrocodone 10 mg-acetaminophen 325 mg tablet take 1-2 tablet by mouth daily ( no more than 5 a day )  11/12/2017   hydrocodone 5 mg-acetaminophen 325 mg tablet take 1 tablet by oral route  every 6 hours as needed for pain     omeprazole 40 mg capsule,delayed release take 1 capsule by oral route 2 times every day before a meal  11/12/2017   ProAir HFA 90 mcg/actuation aerosol inhaler inhale 2 puff by inhalation route  every 4 - 6 hours as needed     promethazine 25 mg tablet take 1 tablet by oral route  every 6 hours as needed     Prozac 20 mg capsule take 2 capsule by oral route  every day in the morning  11/12/2017   quetiapine 100 mg tablet take 1 tablet by oral route 2 times every day     simvastatin 20 mg tablet take 1 tablet by oral route  every day in the evening     Topamax 100 mg tablet take 1 tablet by oral route 3 times every day     trazodone 100 mg tablet take 1 tablet by oral route 3 times every bedtime       ALLERGIES: Ingredient Reaction Medication Name Comment  LATEX Rash     Reviewed, no changes.    Vitals Date Temp F BP Pulse Ht In Wt Lb BMI BSA Pain Score  11/12/2017  151/89 85 61 132 24.94  9/10      IMPRESSION Patient presents with increased headaches. I explained the patient  does not have a programmable shunt valve. Schedule head CT scan and shunt series for further evaluation.  Completed Orders (this encounter) Order Details Reason Side Interpretation Result Initial Treatment Date Region  VP Shunt(with Setting Check)- Lateral Skull/AP Chest/AP Abdomen      11/12/2017  Assessment/Plan # Detail Type Description   1. Assessment Obstructive hydrocephalus (G91.1).       2. 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           Pain Management Plan Pain Scale: 9/10. Method: Numeric Pain Intensity Scale. Location: head. Onset: 04/26/1999. Duration: varies. Quality: discomforting. Pain management follow-up plan of care: Patient taking medication as prescribed..  Schedule head CT scan and shunt series. Follow-up after testing.  Orders: Diagnostic Procedures: Assessment Procedure  G91.1 CT Head/Brain W/o Contrast  Z98.2 VP Shunt(with Setting Check)- Lateral Skull/AP Chest/AP Abdomen             Provider:  Venetia Maxon MDDanae Orleans 11/12/2017 2:44 PM  Dictation edited by: Lajoyce Lauber    CC Providers: Colon Branch MD  [address] [place], [place] [address]              Electronically signed by Danae Orleans. Venetia Maxon MD on 11/16/2017 03:42 PM

## 2018-01-02 ENCOUNTER — Ambulatory Visit (HOSPITAL_COMMUNITY): Payer: Medicare Other | Admitting: Anesthesiology

## 2018-01-02 ENCOUNTER — Inpatient Hospital Stay (HOSPITAL_COMMUNITY)
Admission: RE | Admit: 2018-01-02 | Discharge: 2018-01-03 | DRG: 032 | Disposition: A | Discharge status: Home / Self Care | Payer: Medicare Other | Source: Ambulatory Visit | Attending: Neurosurgery | Admitting: Neurosurgery

## 2018-01-02 ENCOUNTER — Ambulatory Visit (HOSPITAL_COMMUNITY): Payer: Medicare Other | Admitting: Vascular Surgery

## 2018-01-02 ENCOUNTER — Encounter (HOSPITAL_COMMUNITY): Payer: Self-pay

## 2018-01-02 ENCOUNTER — Encounter (HOSPITAL_COMMUNITY)
Admission: RE | Disposition: A | Discharge status: Home / Self Care | Payer: Self-pay | Source: Ambulatory Visit | Attending: Neurosurgery

## 2018-01-02 ENCOUNTER — Other Ambulatory Visit: Payer: Self-pay

## 2018-01-02 DIAGNOSIS — J449 Chronic obstructive pulmonary disease, unspecified: Secondary | ICD-10-CM | POA: Diagnosis not present

## 2018-01-02 DIAGNOSIS — G911 Obstructive hydrocephalus: Secondary | ICD-10-CM | POA: Diagnosis present

## 2018-01-02 DIAGNOSIS — T8501XA Breakdown (mechanical) of ventricular intracranial (communicating) shunt, initial encounter: Secondary | ICD-10-CM | POA: Diagnosis present

## 2018-01-02 DIAGNOSIS — I69359 Hemiplegia and hemiparesis following cerebral infarction affecting unspecified side: Secondary | ICD-10-CM | POA: Diagnosis not present

## 2018-01-02 DIAGNOSIS — F319 Bipolar disorder, unspecified: Secondary | ICD-10-CM | POA: Diagnosis not present

## 2018-01-02 DIAGNOSIS — Z885 Allergy status to narcotic agent status: Secondary | ICD-10-CM

## 2018-01-02 DIAGNOSIS — M1990 Unspecified osteoarthritis, unspecified site: Secondary | ICD-10-CM | POA: Diagnosis not present

## 2018-01-02 DIAGNOSIS — Z888 Allergy status to other drugs, medicaments and biological substances status: Secondary | ICD-10-CM | POA: Diagnosis not present

## 2018-01-02 DIAGNOSIS — Y752 Prosthetic and other implants, materials and neurological devices associated with adverse incidents: Secondary | ICD-10-CM | POA: Diagnosis not present

## 2018-01-02 DIAGNOSIS — Z91040 Latex allergy status: Secondary | ICD-10-CM

## 2018-01-02 DIAGNOSIS — Z79899 Other long term (current) drug therapy: Secondary | ICD-10-CM | POA: Diagnosis not present

## 2018-01-02 DIAGNOSIS — I10 Essential (primary) hypertension: Secondary | ICD-10-CM | POA: Diagnosis not present

## 2018-01-02 DIAGNOSIS — Z91041 Radiographic dye allergy status: Secondary | ICD-10-CM | POA: Diagnosis not present

## 2018-01-02 DIAGNOSIS — T8509XA Other mechanical complication of ventricular intracranial (communicating) shunt, initial encounter: Secondary | ICD-10-CM | POA: Diagnosis present

## 2018-01-02 DIAGNOSIS — Z7982 Long term (current) use of aspirin: Secondary | ICD-10-CM | POA: Diagnosis not present

## 2018-01-02 HISTORY — PX: SHUNT REVISION VENTRICULAR-PERITONEAL: SHX6094

## 2018-01-02 HISTORY — DX: Pneumonia, unspecified organism: J18.9

## 2018-01-02 SURGERY — REVISION, SHUNT, VENTRICULOPERITONEAL
Anesthesia: General | Site: Head

## 2018-01-02 MED ORDER — ONDANSETRON HCL 4 MG/2ML IJ SOLN
4.0000 mg | Freq: Four times a day (QID) | INTRAMUSCULAR | Status: DC | PRN
Start: 2018-01-02 — End: 2018-01-03

## 2018-01-02 MED ORDER — BUPIVACAINE HCL (PF) 0.5 % IJ SOLN
INTRAMUSCULAR | Status: AC
  Filled 2018-01-02: qty 30

## 2018-01-02 MED ORDER — MORPHINE SULFATE (PF) 4 MG/ML IV SOLN: 2.0000 mg | INTRAVENOUS | Status: DC | PRN

## 2018-01-02 MED ORDER — HYDROCODONE-ACETAMINOPHEN 5-325 MG PO TABS: 1.0000 | ORAL_TABLET | Freq: Two times a day (BID) | ORAL | Status: DC | PRN

## 2018-01-02 MED ORDER — PROPOFOL 10 MG/ML IV BOLUS
INTRAVENOUS | Status: DC | PRN
  Administered 2018-01-02: 100 mg via INTRAVENOUS

## 2018-01-02 MED ORDER — POTASSIUM CHLORIDE CRYS ER 10 MEQ PO TBCR
10.0000 meq | EXTENDED_RELEASE_TABLET | Freq: Two times a day (BID) | ORAL | Status: DC
  Administered 2018-01-02: 10 meq via ORAL
  Filled 2018-01-02: qty 1

## 2018-01-02 MED ORDER — FENTANYL CITRATE (PF) 250 MCG/5ML IJ SOLN
INTRAMUSCULAR | Status: DC | PRN
  Administered 2018-01-02 (×2): 50 ug via INTRAVENOUS

## 2018-01-02 MED ORDER — TOPIRAMATE 100 MG PO TABS
200.0000 mg | ORAL_TABLET | Freq: Two times a day (BID) | ORAL | Status: DC
  Administered 2018-01-02: 200 mg via ORAL
  Filled 2018-01-02 (×3): qty 2

## 2018-01-02 MED ORDER — TAMSULOSIN HCL 0.4 MG PO CAPS: 0.4000 mg | ORAL_CAPSULE | Freq: Every day | ORAL | Status: DC

## 2018-01-02 MED ORDER — KCL IN DEXTROSE-NACL 20-5-0.45 MEQ/L-%-% IV SOLN: INTRAVENOUS | Status: DC

## 2018-01-02 MED ORDER — PHENOL 1.4 % MT LIQD: 1.0000 | OROMUCOSAL | Status: DC | PRN

## 2018-01-02 MED ORDER — LIDOCAINE-EPINEPHRINE 1 %-1:100000 IJ SOLN
INTRAMUSCULAR | Status: AC
  Filled 2018-01-02: qty 1

## 2018-01-02 MED ORDER — ACETAMINOPHEN 325 MG PO TABS: 650.0000 mg | ORAL_TABLET | ORAL | Status: DC | PRN

## 2018-01-02 MED ORDER — POLYETHYLENE GLYCOL 3350 17 G PO PACK: 17.0000 g | PACK | Freq: Every day | ORAL | Status: DC | PRN

## 2018-01-02 MED ORDER — ACETAMINOPHEN 650 MG RE SUPP: 650.0000 mg | RECTAL | Status: DC | PRN

## 2018-01-02 MED ORDER — VANCOMYCIN HCL 1000 MG IV SOLR
INTRAVENOUS | Status: AC
  Filled 2018-01-02: qty 1000

## 2018-01-02 MED ORDER — ASPIRIN EC 81 MG PO TBEC: 81.0000 mg | DELAYED_RELEASE_TABLET | Freq: Every day | ORAL | Status: DC

## 2018-01-02 MED ORDER — CEFAZOLIN SODIUM-DEXTROSE 2-4 GM/100ML-% IV SOLN
2.0000 g | Freq: Three times a day (TID) | INTRAVENOUS | Status: AC
  Administered 2018-01-02 – 2018-01-03 (×2): 2 g via INTRAVENOUS
  Filled 2018-01-02 (×2): qty 100

## 2018-01-02 MED ORDER — QUETIAPINE FUMARATE 300 MG PO TABS
300.0000 mg | ORAL_TABLET | Freq: Every evening | ORAL | Status: DC | PRN
  Administered 2018-01-02: 300 mg via ORAL
  Filled 2018-01-02 (×2): qty 1

## 2018-01-02 MED ORDER — 0.9 % SODIUM CHLORIDE (POUR BTL) OPTIME
TOPICAL | Status: DC | PRN
  Administered 2018-01-02: 1000 mL

## 2018-01-02 MED ORDER — HYDROMORPHONE HCL 1 MG/ML IJ SOLN
0.2500 mg | INTRAMUSCULAR | Status: DC | PRN
  Administered 2018-01-02 (×2): 0.5 mg via INTRAVENOUS

## 2018-01-02 MED ORDER — LIDOCAINE-EPINEPHRINE 1 %-1:100000 IJ SOLN
INTRAMUSCULAR | Status: DC | PRN
  Administered 2018-01-02: 3 mL

## 2018-01-02 MED ORDER — PREGABALIN 75 MG PO CAPS
200.0000 mg | ORAL_CAPSULE | Freq: Three times a day (TID) | ORAL | Status: DC
  Administered 2018-01-02: 200 mg via ORAL
  Filled 2018-01-02: qty 1

## 2018-01-02 MED ORDER — CHLORHEXIDINE GLUCONATE CLOTH 2 % EX PADS: 6.0000 | MEDICATED_PAD | Freq: Once | CUTANEOUS | Status: DC

## 2018-01-02 MED ORDER — TRAZODONE HCL 150 MG PO TABS
300.0000 mg | ORAL_TABLET | Freq: Every evening | ORAL | Status: DC | PRN
  Administered 2018-01-02: 300 mg via ORAL
  Filled 2018-01-02: qty 2

## 2018-01-02 MED ORDER — LIDOCAINE 2% (20 MG/ML) 5 ML SYRINGE
INTRAMUSCULAR | Status: DC | PRN
  Administered 2018-01-02: 60 mg via INTRAVENOUS

## 2018-01-02 MED ORDER — MIDAZOLAM HCL 2 MG/2ML IJ SOLN
INTRAMUSCULAR | Status: DC | PRN
  Administered 2018-01-02: 2 mg via INTRAVENOUS

## 2018-01-02 MED ORDER — DOCUSATE SODIUM 100 MG PO CAPS
100.0000 mg | ORAL_CAPSULE | Freq: Two times a day (BID) | ORAL | Status: DC
  Administered 2018-01-02: 100 mg via ORAL
  Filled 2018-01-02: qty 1

## 2018-01-02 MED ORDER — CEFAZOLIN SODIUM-DEXTROSE 2-4 GM/100ML-% IV SOLN
2.0000 g | INTRAVENOUS | Status: AC
  Administered 2018-01-02: 2 g via INTRAVENOUS
  Filled 2018-01-02: qty 100

## 2018-01-02 MED ORDER — LACTATED RINGERS IV SOLN
INTRAVENOUS | Status: DC | PRN
  Administered 2018-01-02 (×2): via INTRAVENOUS

## 2018-01-02 MED ORDER — CYANOCOBALAMIN 500 MCG PO TABS
500.0000 ug | ORAL_TABLET | Freq: Every day | ORAL | Status: DC
  Filled 2018-01-02: qty 1

## 2018-01-02 MED ORDER — BUPIVACAINE HCL (PF) 0.5 % IJ SOLN
INTRAMUSCULAR | Status: DC | PRN
  Administered 2018-01-02: 3 mL

## 2018-01-02 MED ORDER — SODIUM CHLORIDE 0.9% FLUSH: 3.0000 mL | INTRAVENOUS | Status: DC | PRN

## 2018-01-02 MED ORDER — BACITRACIN ZINC 500 UNIT/GM EX OINT
TOPICAL_OINTMENT | CUTANEOUS | Status: AC
  Filled 2018-01-02: qty 28.35

## 2018-01-02 MED ORDER — HYDROMORPHONE HCL 1 MG/ML IJ SOLN
INTRAMUSCULAR | Status: AC
  Administered 2018-01-02: 0.5 mg via INTRAVENOUS
  Filled 2018-01-02: qty 1

## 2018-01-02 MED ORDER — THROMBIN (RECOMBINANT) 20000 UNITS EX SOLR
CUTANEOUS | Status: AC
  Filled 2018-01-02: qty 20000

## 2018-01-02 MED ORDER — SODIUM CHLORIDE 0.9 % IR SOLN
Status: DC | PRN
  Administered 2018-01-02: 500 mL

## 2018-01-02 MED ORDER — ALBUTEROL SULFATE HFA 108 (90 BASE) MCG/ACT IN AERS: 1.0000 | INHALATION_SPRAY | Freq: Four times a day (QID) | RESPIRATORY_TRACT | Status: DC | PRN

## 2018-01-02 MED ORDER — MOMETASONE FURO-FORMOTEROL FUM 200-5 MCG/ACT IN AERO
2.0000 | INHALATION_SPRAY | Freq: Two times a day (BID) | RESPIRATORY_TRACT | Status: DC
  Filled 2018-01-02: qty 8.8

## 2018-01-02 MED ORDER — LACTATED RINGERS IV SOLN: INTRAVENOUS | Status: DC

## 2018-01-02 MED ORDER — PHENYLEPHRINE 40 MCG/ML (10ML) SYRINGE FOR IV PUSH (FOR BLOOD PRESSURE SUPPORT)
PREFILLED_SYRINGE | INTRAVENOUS | Status: DC | PRN
  Administered 2018-01-02: 80 ug via INTRAVENOUS
  Administered 2018-01-02: 40 ug via INTRAVENOUS
  Administered 2018-01-02 (×2): 80 ug via INTRAVENOUS

## 2018-01-02 MED ORDER — EPHEDRINE SULFATE-NACL 50-0.9 MG/10ML-% IV SOSY
PREFILLED_SYRINGE | INTRAVENOUS | Status: DC | PRN
  Administered 2018-01-02: 15 mg via INTRAVENOUS
  Administered 2018-01-02: 5 mg via INTRAVENOUS
  Administered 2018-01-02: 10 mg via INTRAVENOUS

## 2018-01-02 MED ORDER — DEXAMETHASONE SODIUM PHOSPHATE 10 MG/ML IJ SOLN
INTRAMUSCULAR | Status: DC | PRN
  Administered 2018-01-02: 10 mg via INTRAVENOUS

## 2018-01-02 MED ORDER — ONDANSETRON HCL 4 MG PO TABS: 4.0000 mg | ORAL_TABLET | Freq: Four times a day (QID) | ORAL | Status: DC | PRN

## 2018-01-02 MED ORDER — PROMETHAZINE HCL 25 MG PO TABS
25.0000 mg | ORAL_TABLET | Freq: Three times a day (TID) | ORAL | Status: DC | PRN
Start: 2018-01-02 — End: 2018-01-03

## 2018-01-02 MED ORDER — ZOLPIDEM TARTRATE 5 MG PO TABS: 5.0000 mg | ORAL_TABLET | Freq: Every evening | ORAL | Status: DC | PRN

## 2018-01-02 MED ORDER — ALPRAZOLAM 0.5 MG PO TABS
0.5000 mg | ORAL_TABLET | Freq: Four times a day (QID) | ORAL | Status: DC | PRN
  Administered 2018-01-02: 0.5 mg via ORAL
  Filled 2018-01-02: qty 1

## 2018-01-02 MED ORDER — FENTANYL CITRATE (PF) 250 MCG/5ML IJ SOLN
INTRAMUSCULAR | Status: AC
  Filled 2018-01-02: qty 5

## 2018-01-02 MED ORDER — SODIUM CHLORIDE 0.9% FLUSH: 3.0000 mL | Freq: Two times a day (BID) | INTRAVENOUS | Status: DC

## 2018-01-02 MED ORDER — PROPOFOL 10 MG/ML IV BOLUS
INTRAVENOUS | Status: AC
  Filled 2018-01-02: qty 20

## 2018-01-02 MED ORDER — VITAMIN C 500 MG PO TABS
1000.0000 mg | ORAL_TABLET | Freq: Every day | ORAL | Status: DC
  Filled 2018-01-02: qty 2

## 2018-01-02 MED ORDER — SUGAMMADEX SODIUM 200 MG/2ML IV SOLN
INTRAVENOUS | Status: DC | PRN
  Administered 2018-01-02: 200 mg via INTRAVENOUS

## 2018-01-02 MED ORDER — PROMETHAZINE HCL 25 MG/ML IJ SOLN: 6.2500 mg | INTRAMUSCULAR | Status: DC | PRN

## 2018-01-02 MED ORDER — HYDROCODONE-ACETAMINOPHEN 5-325 MG PO TABS: 2.0000 | ORAL_TABLET | ORAL | Status: DC | PRN

## 2018-01-02 MED ORDER — MIDAZOLAM HCL 2 MG/2ML IJ SOLN
INTRAMUSCULAR | Status: AC
  Filled 2018-01-02: qty 2

## 2018-01-02 MED ORDER — BISACODYL 10 MG RE SUPP
10.0000 mg | Freq: Every day | RECTAL | Status: DC | PRN
Start: 2018-01-02 — End: 2018-01-03

## 2018-01-02 MED ORDER — THROMBIN (RECOMBINANT) 20000 UNITS EX SOLR
CUTANEOUS | Status: DC | PRN
  Administered 2018-01-02: 20 mL via TOPICAL

## 2018-01-02 MED ORDER — HYDROXYZINE HCL 25 MG PO TABS
25.0000 mg | ORAL_TABLET | Freq: Four times a day (QID) | ORAL | Status: DC | PRN
  Administered 2018-01-02: 25 mg via ORAL
  Filled 2018-01-02: qty 1

## 2018-01-02 MED ORDER — HYDROCODONE-ACETAMINOPHEN 5-325 MG PO TABS
1.0000 | ORAL_TABLET | ORAL | Status: DC | PRN
  Administered 2018-01-02 – 2018-01-03 (×2): 1 via ORAL
  Filled 2018-01-02 (×2): qty 1

## 2018-01-02 MED ORDER — SODIUM CHLORIDE 0.9 % IV SOLN: 250.0000 mL | INTRAVENOUS | Status: DC

## 2018-01-02 MED ORDER — ROCURONIUM BROMIDE 10 MG/ML (PF) SYRINGE
PREFILLED_SYRINGE | INTRAVENOUS | Status: DC | PRN
  Administered 2018-01-02: 10 mg via INTRAVENOUS
  Administered 2018-01-02: 40 mg via INTRAVENOUS

## 2018-01-02 MED ORDER — BACITRACIN ZINC 500 UNIT/GM EX OINT
TOPICAL_OINTMENT | CUTANEOUS | Status: DC | PRN
  Administered 2018-01-02: 1 via TOPICAL

## 2018-01-02 MED ORDER — MENTHOL 3 MG MT LOZG: 1.0000 | LOZENGE | OROMUCOSAL | Status: DC | PRN

## 2018-01-02 MED ORDER — MEPERIDINE HCL 25 MG/ML IJ SOLN: 6.2500 mg | INTRAMUSCULAR | Status: DC | PRN

## 2018-01-02 MED ORDER — FLEET ENEMA 7-19 GM/118ML RE ENEM: 1.0000 | ENEMA | Freq: Once | RECTAL | Status: DC | PRN

## 2018-01-02 MED ORDER — ONDANSETRON HCL 4 MG/2ML IJ SOLN
INTRAMUSCULAR | Status: DC | PRN
  Administered 2018-01-02: 4 mg via INTRAVENOUS

## 2018-01-02 MED ORDER — LEVETIRACETAM 500 MG PO TABS
500.0000 mg | ORAL_TABLET | Freq: Two times a day (BID) | ORAL | Status: DC
  Administered 2018-01-02: 500 mg via ORAL
  Filled 2018-01-02 (×3): qty 1

## 2018-01-02 MED ORDER — LACTATED RINGERS IV SOLN
INTRAVENOUS | Status: DC
  Administered 2018-01-02: 14:00:00 via INTRAVENOUS

## 2018-01-02 SURGICAL SUPPLY — 67 items
BLADE CLIPPER SURG (BLADE) ×6 IMPLANT
BLADE SURG 11 STRL SS (BLADE) ×3 IMPLANT
BOOT SUTURE AID YELLOW STND (SUTURE) ×3 IMPLANT
BUR ACORN 6.0 PRECISION (BURR) ×2 IMPLANT
BUR ACORN 6.0MM PRECISION (BURR) ×1
CABLE BIPOLOR RESECTION CORD (MISCELLANEOUS) ×3 IMPLANT
CANISTER SUCT 3000ML PPV (MISCELLANEOUS) ×3 IMPLANT
CARTRIDGE OIL MAESTRO DRILL (MISCELLANEOUS) ×1 IMPLANT
CATH ROBINSON RED A/P 10FR (CATHETERS) IMPLANT
CONNECTOR SHUNT 1.2MMX1.8MM (MISCELLANEOUS) ×3 IMPLANT
DECANTER SPIKE VIAL GLASS SM (MISCELLANEOUS) ×3 IMPLANT
DERMABOND ADVANCED (GAUZE/BANDAGES/DRESSINGS) ×2
DERMABOND ADVANCED .7 DNX12 (GAUZE/BANDAGES/DRESSINGS) ×1 IMPLANT
DIFFUSER DRILL AIR PNEUMATIC (MISCELLANEOUS) ×3 IMPLANT
DRAPE INCISE IOBAN 85X60 (DRAPES) ×3 IMPLANT
DRAPE ORTHO SPLIT 77X108 STRL (DRAPES) ×2
DRAPE POUCH INSTRU U-SHP 10X18 (DRAPES) ×3 IMPLANT
DRAPE SURG ORHT 6 SPLT 77X108 (DRAPES) ×1 IMPLANT
DRSG OPSITE POSTOP 4X6 (GAUZE/BANDAGES/DRESSINGS) ×3 IMPLANT
DRSG TELFA 3X8 NADH (GAUZE/BANDAGES/DRESSINGS) ×3 IMPLANT
ELECT REM PT RETURN 9FT ADLT (ELECTROSURGICAL) ×3
ELECTRODE REM PT RTRN 9FT ADLT (ELECTROSURGICAL) ×1 IMPLANT
GAUZE SPONGE 4X4 12PLY STRL (GAUZE/BANDAGES/DRESSINGS) ×3 IMPLANT
GAUZE SPONGE 4X4 16PLY XRAY LF (GAUZE/BANDAGES/DRESSINGS) IMPLANT
GLOVE BIO SURGEON STRL SZ8 (GLOVE) ×3 IMPLANT
GLOVE BIOGEL PI IND STRL 8 (GLOVE) ×1 IMPLANT
GLOVE BIOGEL PI IND STRL 8.5 (GLOVE) ×1 IMPLANT
GLOVE BIOGEL PI INDICATOR 8 (GLOVE) ×2
GLOVE BIOGEL PI INDICATOR 8.5 (GLOVE) ×2
GLOVE ECLIPSE 8.0 STRL XLNG CF (GLOVE) ×3 IMPLANT
GLOVE EXAM NITRILE LRG STRL (GLOVE) IMPLANT
GLOVE EXAM NITRILE XL STR (GLOVE) IMPLANT
GLOVE EXAM NITRILE XS STR PU (GLOVE) IMPLANT
GLOVE SURG SS PI 6.5 STRL IVOR (GLOVE) ×3 IMPLANT
GLOVE SURG SS PI 7.5 STRL IVOR (GLOVE) ×3 IMPLANT
GLOVE SURG SS PI 8.0 STRL IVOR (GLOVE) ×6 IMPLANT
GLOVE SURG SS PI 8.5 STRL IVOR (GLOVE) ×2
GLOVE SURG SS PI 8.5 STRL STRW (GLOVE) ×1 IMPLANT
GOWN STRL REUS W/ TWL LRG LVL3 (GOWN DISPOSABLE) ×1 IMPLANT
GOWN STRL REUS W/ TWL XL LVL3 (GOWN DISPOSABLE) ×1 IMPLANT
GOWN STRL REUS W/TWL 2XL LVL3 (GOWN DISPOSABLE) ×9 IMPLANT
GOWN STRL REUS W/TWL LRG LVL3 (GOWN DISPOSABLE) ×2
GOWN STRL REUS W/TWL XL LVL3 (GOWN DISPOSABLE) ×2
HEMOSTAT SURGICEL 2X14 (HEMOSTASIS) IMPLANT
KIT BASIN OR (CUSTOM PROCEDURE TRAY) ×3 IMPLANT
KIT ROOM TURNOVER OR (KITS) ×3 IMPLANT
NEEDLE HYPO 25X1 1.5 SAFETY (NEEDLE) ×3 IMPLANT
NS IRRIG 1000ML POUR BTL (IV SOLUTION) ×3 IMPLANT
OIL CARTRIDGE MAESTRO DRILL (MISCELLANEOUS) ×3
PACK LAMINECTOMY NEURO (CUSTOM PROCEDURE TRAY) ×3 IMPLANT
PAD ARMBOARD 7.5X6 YLW CONV (MISCELLANEOUS) ×9 IMPLANT
SHEATH PERITONEAL INTRO 46 (MISCELLANEOUS) IMPLANT
SHEATH PERITONEAL INTRO 61 (MISCELLANEOUS) IMPLANT
SPONGE SURGIFOAM ABS GEL 100 (HEMOSTASIS) ×3 IMPLANT
STAPLER SKIN PROX WIDE 3.9 (STAPLE) ×3 IMPLANT
SUT ETHILON 3 0 PS 1 (SUTURE) IMPLANT
SUT NURALON 4 0 TR CR/8 (SUTURE) IMPLANT
SUT SILK 0 TIES 10X30 (SUTURE) ×3 IMPLANT
SUT SILK 2 0 PERMA HAND 18 BK (SUTURE) ×3 IMPLANT
SUT SILK 2 0 TIES 10X30 (SUTURE) ×3 IMPLANT
SUT VIC AB 2-0 CP2 18 (SUTURE) ×3 IMPLANT
SUT VIC AB 3-0 SH 8-18 (SUTURE) ×3 IMPLANT
SYR CONTROL 10ML LL (SYRINGE) ×3 IMPLANT
TOWEL GREEN STERILE (TOWEL DISPOSABLE) ×3 IMPLANT
TOWEL GREEN STERILE FF (TOWEL DISPOSABLE) ×3 IMPLANT
VALVE PROGRAM W DISTAL CATH (Prosthesis & Implant Heart) ×3 IMPLANT
WATER STERILE IRR 1000ML POUR (IV SOLUTION) ×3 IMPLANT

## 2018-01-02 NOTE — Anesthesia Postprocedure Evaluation (Signed)
Anesthesia Post Note  Patient: Kelli Miranda  Procedure(s) Performed: SHUNT REVISION  with programmable valve (N/A Head)     Patient location during evaluation: PACU Anesthesia Type: General Level of consciousness: awake and alert, patient cooperative and oriented Pain management: pain level controlled Vital Signs Assessment: post-procedure vital signs reviewed and stable Respiratory status: spontaneous breathing, nonlabored ventilation, respiratory function stable and patient connected to nasal cannula oxygen Cardiovascular status: blood pressure returned to baseline and stable Postop Assessment: no apparent nausea or vomiting Anesthetic complications: no    Last Vitals:  Vitals:   01/02/18 1800 01/02/18 1814  BP: (!) 121/93 104/86  Pulse: 95 88  Resp: 13 18  Temp:  36.5 C  SpO2: 97% 100%    Last Pain:  Vitals:   01/02/18 1745  TempSrc:   PainSc: 4     LLE Motor Response: Purposeful movement;Responds to commands (01/02/18 1814) LLE Sensation: No tingling;No pain;No numbness (01/02/18 1814) RLE Motor Response: Purposeful movement;Responds to commands (01/02/18 1814) RLE Sensation: No tingling;No pain;No numbness (01/02/18 1814)      Erling CruzJACKSON,E. Lizett Chowning

## 2018-01-02 NOTE — Progress Notes (Signed)
Report to Dr. Hart RochesterHollis, regarding recent occurrence of Pneumonia & treated by PCP, Georgiana Shore. Butler.  Pt. Reporting that she was on Prednisone & antibiotic, unsure of the name, have finished both.

## 2018-01-02 NOTE — Anesthesia Procedure Notes (Signed)
Procedure Name: Intubation Date/Time: 01/02/2018 3:48 PM Performed by: Teressa Lower., CRNA Pre-anesthesia Checklist: Patient identified, Emergency Drugs available, Suction available and Patient being monitored Patient Re-evaluated:Patient Re-evaluated prior to induction Oxygen Delivery Method: Circle system utilized Preoxygenation: Pre-oxygenation with 100% oxygen Induction Type: IV induction Ventilation: Mask ventilation without difficulty Laryngoscope Size: Mac and 3 Grade View: Grade I Tube type: Oral Tube size: 7.0 mm Number of attempts: 1 Airway Equipment and Method: Stylet and Oral airway Placement Confirmation: ETT inserted through vocal cords under direct vision,  positive ETCO2 and breath sounds checked- equal and bilateral Secured at: 20 cm Tube secured with: Tape Dental Injury: Teeth and Oropharynx as per pre-operative assessment

## 2018-01-02 NOTE — Brief Op Note (Signed)
01/02/2018  5:12 PM  PATIENT:  Kelli Miranda  42 y.o. female  PRE-OPERATIVE DIAGNOSIS:  Hemiparesis; Obstructive hydrocephalus, VP shunt malfunction  POST-OPERATIVE DIAGNOSIS:  Hemiparesis; Obstructive hydrocephalus, VP shunt malfunction  PROCEDURE:  Procedure(s): SHUNT REVISION  with programmable valve (N/A)  SURGEON:  Surgeon(s) and Role:    [name]* Shelbi Vaccaro, MD - Primary    * Coletta Memosabbell, Kyle, MD - Assisting  PHYSICIAN ASSISTANT:   ASSISTANTS: Poteat, RN   ANESTHESIA:   general  EBL:  30 mL   BLOOD ADMINISTERED:none  DRAINS: none   LOCAL MEDICATIONS USED:  LIDOCAINE   SPECIMEN:  No Specimen  DISPOSITION OF SPECIMEN:  N/A  COUNTS:  YES  TOURNIQUET:  * No tourniquets in log *  DICTATION:  Indications:  Patient is 42 year old woman with obstructive hydrocephalus, s/p VP shunt placement, with overdrainage and slit ventricles.  Procedure:  Patient was brought to OR and underwent induction of general anesthesia.  Posterior right occipital scalp was shaved, then scalp, chest, abdomen were prepped with betadine scrub and Duraprep.  Area of planned incision was infiltrated with lidocaine.  Previous scalp incision was reopened and shunt valve was exposed.  Tubing was disconnected, valve was replaced with Codman programmable valve set to pressure of 100.  Tubing was connected to distal catheter with straight connector. All junctions were secured with 2-0 silk ties.  There was brisk flow from cranial catheter.  Wounds were closed with 2-0 Vicryl sutures and staples.  A sterile occlusive dressing was placed.  Patient was extubated in the OR and taken to Recovery in stable and satisfactory condition, having tolerated her surgery well.    PLAN OF CARE: Admit to inpatient   PATIENT DISPOSITION:  PACU - hemodynamically stable.   Delay start of Pharmacological VTE agent (>24hrs) due to surgical blood loss or risk of bleeding: yes

## 2018-01-02 NOTE — Anesthesia Preprocedure Evaluation (Addendum)
Anesthesia Evaluation  Patient identified by MRN, date of birth, ID band Patient awake    Reviewed: Allergy & Precautions, NPO status , Patient's Chart, lab work & pertinent test results  Airway Mallampati: II  TM Distance: >3 FB Neck ROM: Full    Dental  (+) Edentulous Upper, Edentulous Lower   Pulmonary pneumonia, resolved, COPD, Current Smoker,     + decreased breath sounds      Cardiovascular hypertension,  Rhythm:Regular Rate:Normal     Neuro/Psych  Headaches, Seizures -,  PSYCHIATRIC DISORDERS Bipolar Disorder CVA, Residual Symptoms    GI/Hepatic negative GI ROS, Neg liver ROS,   Endo/Other  negative endocrine ROS  Renal/GU negative Renal ROS     Musculoskeletal  (+) Arthritis ,   Abdominal Normal abdominal exam  (+)   Peds  Hematology   Anesthesia Other Findings   Reproductive/Obstetrics negative OB ROS                           Lab Results  Component Value Date   WBC 8.3 12/29/2017   HGB 13.2 12/29/2017   HCT 38.3 12/29/2017   MCV 90.3 12/29/2017   PLT 274 12/29/2017   Echo: - Left ventricle: The cavity size was normal. Wall thickness was   normal. Systolic function was normal. The estimated ejection   fraction was in the range of 55% to 60%. Although no diagnostic   regional wall motion abnormality was identified, this possibility   cannot be completely excluded on the basis of this study. Left   ventricular diastolic function parameters were normal. - Aortic valve: There was no stenosis. - Mitral valve: There was no significant regurgitation. - Right ventricle: The cavity size was normal. Systolic function   was normal. - Pulmonary arteries: No complete TR doppler jet so unable to   estimate PA systolic pressure. - Inferior vena cava: The vessel was normal in size. The   respirophasic diameter changes were in the normal range (>= 50%),   consistent with normal central  venous pressure. Anesthesia Physical Anesthesia Plan  ASA: III  Anesthesia Plan: General   Post-op Pain Management:    Induction: Intravenous  PONV Risk Score and Plan: 3 and Ondansetron, Dexamethasone and Midazolam  Airway Management Planned: Oral ETT  Additional Equipment:   Intra-op Plan:   Post-operative Plan: Extubation in OR  Informed Consent: I have reviewed the patients History and Physical, chart, labs and discussed the procedure including the risks, benefits and alternatives for the proposed anesthesia with the patient or authorized representative who has indicated his/her understanding and acceptance.   Dental advisory given  Plan Discussed with: CRNA  Anesthesia Plan Comments:         Anesthesia Quick Evaluation

## 2018-01-02 NOTE — Progress Notes (Signed)
Patient arrived to unit at 1840 from PACU. Very anxious and refusing to get in the bed. Insisting on washing off betadine on skin around neck and left cheek rubbing hard. Writer explained to patient that this is not the appropriate time as she just came out of surgery and moving her head too much is not good. Patient insisting on staying in the bathroom and put on her pajamas then insisted on going to the cafeteria herself to get food. Writer explained to patient that it is not advisable to do so at this time and that her dinner could be ordered from the kitchen and delivered to her room but she insisted on going. Will notify oncoming nurse. Family in room with her at this time.

## 2018-01-02 NOTE — Transfer of Care (Signed)
Immediate Anesthesia Transfer of Care Note  Patient: Kelli Miranda  Procedure(s) Performed: SHUNT REVISION  with programmable valve (N/A Head)  Patient Location: PACU  Anesthesia Type:General  Level of Consciousness: awake, alert  and oriented  Airway & Oxygen Therapy: Patient Spontanous Breathing and Patient connected to face mask oxygen  Post-op Assessment: Report given to RN and Post -op Vital signs reviewed and stable  Post vital signs: Reviewed and stable  Last Vitals:  Vitals:   01/02/18 1310  BP: 97/65  Pulse: 87  Resp: 18  Temp: 36.7 C  SpO2: 99%    Last Pain:  Vitals:   01/02/18 1310  TempSrc: Oral  PainSc:       Patients Stated Pain Goal: 2 (01/02/18 1249)  Complications: No apparent anesthesia complications

## 2018-01-02 NOTE — Op Note (Signed)
01/02/2018  5:12 PM  PATIENT:  Kelli Miranda  41 y.o. female  PRE-OPERATIVE DIAGNOSIS:  Hemiparesis; Obstructive hydrocephalus, VP shunt malfunction  POST-OPERATIVE DIAGNOSIS:  Hemiparesis; Obstructive hydrocephalus, VP shunt malfunction  PROCEDURE:  Procedure(s): SHUNT REVISION  with programmable valve (N/A)  SURGEON:  Surgeon(s) and Role:    * Addysyn Fern, MD - Primary    * Cabbell, Kyle, MD - Assisting  PHYSICIAN ASSISTANT:   ASSISTANTS: Poteat, RN   ANESTHESIA:   general  EBL:  30 mL   BLOOD ADMINISTERED:none  DRAINS: none   LOCAL MEDICATIONS USED:  LIDOCAINE   SPECIMEN:  No Specimen  DISPOSITION OF SPECIMEN:  N/A  COUNTS:  YES  TOURNIQUET:  * No tourniquets in log *  DICTATION:  Indications:  Patient is 41 year old woman with obstructive hydrocephalus, s/p VP shunt placement, with overdrainage and slit ventricles.  Procedure:  Patient was brought to OR and underwent induction of general anesthesia.  Posterior right occipital scalp was shaved, then scalp, chest, abdomen were prepped with betadine scrub and Duraprep.  Area of planned incision was infiltrated with lidocaine.  Previous scalp incision was reopened and shunt valve was exposed.  Tubing was disconnected, valve was replaced with Codman programmable valve set to pressure of 100.  Tubing was connected to distal catheter with straight connector. All junctions were secured with 2-0 silk ties.  There was brisk flow from cranial catheter.  Wounds were closed with 2-0 Vicryl sutures and staples.  A sterile occlusive dressing was placed.  Patient was extubated in the OR and taken to Recovery in stable and satisfactory condition, having tolerated her surgery well.    PLAN OF CARE: Admit to inpatient   PATIENT DISPOSITION:  PACU - hemodynamically stable.   Delay start of Pharmacological VTE agent (>24hrs) due to surgical blood loss or risk of bleeding: yes  

## 2018-01-02 NOTE — Interval H&P Note (Signed)
History and Physical Interval Note:  01/02/2018 3:09 PM  Kelli Miranda  has presented today for surgery, with the diagnosis of Hemiparesis; Obstructive hydrocephalus  The various methods of treatment have been discussed with the patient and family. After consideration of risks, benefits and other options for treatment, the patient has consented to  Procedure(s) with comments: SHUNT REVISION  with programmable valve (N/A) - SHUNT REVISION  with programmable valve as a surgical intervention .  The patient's history has been reviewed, patient examined, no change in status, stable for surgery.  I have reviewed the patient's chart and labs.  Questions were answered to the patient's satisfaction.     Katelind Pytel D

## 2018-01-03 MED ORDER — HYDROCODONE-ACETAMINOPHEN 5-325 MG PO TABS: 1.0000 | ORAL_TABLET | ORAL | 0 refills | Status: DC | PRN

## 2018-01-03 NOTE — Progress Notes (Signed)
Patient is discharged from room 3C11 at this time. Alert and in stable condition. IV site d/c'd and instructions read to patient and daughter with understanding verbalized. Patient declined PT/OT this morning, insisting on going home. Left unit via wheelchair with all belongings at side.

## 2018-01-03 NOTE — Progress Notes (Signed)
Subjective: Patient reports doing well  Objective: Vital signs in last 24 hours: Temp:  [97.4 F (36.3 C)-98.1 F (36.7 C)] 97.9 F (36.6 C) (01/05 0430) Pulse Rate:  [87-103] 95 (01/05 0430) Resp:  [13-21] 18 (01/05 0430) BP: (97-126)/(65-93) 100/79 (01/05 0430) SpO2:  [97 %-100 %] 98 % (01/05 0430) Weight:  [62.1 kg (137 lb)] 62.1 kg (137 lb) (01/04 1910)  Intake/Output from previous day: 01/04 0701 - 01/05 0700 In: 1555 [P.O.:480; I.V.:1000] Out: 30 [Blood:30] Intake/Output this shift: Total I/O In: 240 [P.O.:240] Out: -   Physical Exam: Incision CDI.  Minimal headache.  Neuro intact.  Lab Results: No results for input(s): WBC, HGB, HCT, PLT in the last 72 hours. BMET No results for input(s): NA, K, CL, CO2, GLUCOSE, BUN, CREATININE, CALCIUM in the last 72 hours.  Studies/Results: No results found.  Assessment/Plan: Doing well.  Discharge home.    LOS: 1 day    Dorian HeckleSTERN,Haden Suder D, MD 01/03/2018, 6:52 AM

## 2018-01-03 NOTE — Discharge Summary (Signed)
Physician Discharge Summary  Patient ID: Kelli Miranda MRN: [id number] DOB/AGE: 1976-11-04 42 y.o.  Admit date: 01/02/2018 Discharge date: 01/03/2018  Admission Diagnoses:VP shunt malfunction with headaches and overdrainage  Discharge Diagnoses: Same Active Problems:   Malfunction of ventriculo-peritoneal shunt [facility])   Discharged Condition: good  Hospital Course: Patient underwent VP shunt revision with placement of Codman programmable valve set at 100 mm H2O.  She did well and was discharged home on am of POD1.  Consults: None  Significant Diagnostic Studies: None  Treatments: surgery:Patient underwent VP shunt revision with placement of Codman programmable valve set at 100 mm H2O  Discharge Exam: Blood pressure 100/79, pulse 95, temperature 97.9 F (36.6 C), temperature source Oral, resp. rate 18, height 5' 1.5" (1.562 m), weight 62.1 kg (137 lb), SpO2 98 %. Neurologic: Alert and oriented X 3, normal strength and tone. Normal symmetric reflexes. Normal coordination and gait Wound:CDI  Disposition: Home  Discharge Instructions    Diet - low sodium heart healthy   Complete by:  As directed    Increase activity slowly   Complete by:  As directed      Allergies as of 01/03/2018      Reactions   Oxycodone-acetaminophen Other (See Comments)   Facial Edema (intolerance)   Iodinated Diagnostic Agents Other (See Comments)   Unknown per pt   Latex Hives   Percocet [oxycodone-acetaminophen] Swelling   Face swelling    Imitrex [sumatriptan] Hives, Palpitations      Medication List    TAKE these medications   ALPRAZolam 0.5 MG tablet Commonly known as:  XANAX Take 0.5 mg by mouth 4 (four) times daily as needed for anxiety.   aspirin EC 81 MG tablet Take 1 tablet (81 mg total) by mouth daily.   cyanocobalamin 500 MCG tablet Take 1 tablet (500 mcg total) by mouth daily.   Fluticasone-Salmeterol 250-50 MCG/DOSE Aepb Commonly known as:  ADVAIR Inhale 1 puff into  the lungs 2 (two) times daily.   HYDROcodone-acetaminophen 5-325 MG tablet Commonly known as:  NORCO/VICODIN Take 1 tablet by mouth 2 (two) times daily as needed for moderate pain. What changed:  Another medication with the same name was added. Make sure you understand how and when to take each.   HYDROcodone-acetaminophen 5-325 MG tablet Commonly known as:  NORCO/VICODIN Take 1-2 tablets by mouth every 4 (four) hours as needed for moderate pain ((score 4 to 6)). What changed:  You were already taking a medication with the same name, and this prescription was added. Make sure you understand how and when to take each.   hydrOXYzine 25 MG tablet Commonly known as:  ATARAX/VISTARIL Take 25 mg by mouth 4 (four) times daily as needed for anxiety.   levETIRAcetam 500 MG tablet Commonly known as:  KEPPRA Take 500 mg by mouth 2 (two) times daily.   potassium chloride 10 MEQ tablet Commonly known as:  K-DUR,KLOR-CON Take 10 mEq by mouth 2 (two) times daily.   pregabalin 100 MG capsule Commonly known as:  LYRICA Take 200 mg by mouth 3 (three) times daily.   promethazine 25 MG tablet Commonly known as:  PHENERGAN TAKE 1 TABLET BY MOUTH EVERY 6 HOURS AS NEEDED FOR NAUSEA What changed:    how much to take  how to take this  when to take this  additional instructions   QUEtiapine 100 MG tablet Commonly known as:  SEROQUEL Take 300 mg by mouth at bedtime as needed (sleep).   topiramate 200 MG tablet  Commonly known as:  TOPAMAX Take 200 mg by mouth 2 (two) times daily.   traZODone 100 MG tablet Commonly known as:  DESYREL Take 300 mg by mouth at bedtime as needed for sleep.   VENTOLIN HFA 108 (90 Base) MCG/ACT inhaler Generic drug:  albuterol INHALE 2 PUFFS BY MOUTH EVERY 4 TO 6 HOURS AS NEEDED FOR WHEEZING.   vitamin C 1000 MG tablet Take 1,000 mg by mouth daily.        Signed: Dorian Heckle, MD 01/03/2018, 6:50 AM

## 2018-01-05 ENCOUNTER — Encounter (HOSPITAL_COMMUNITY): Payer: Self-pay | Admitting: Neurosurgery

## 2018-07-10 ENCOUNTER — Encounter (HOSPITAL_BASED_OUTPATIENT_CLINIC_OR_DEPARTMENT_OTHER): Payer: Self-pay | Admitting: *Deleted

## 2018-07-10 ENCOUNTER — Observation Stay (HOSPITAL_BASED_OUTPATIENT_CLINIC_OR_DEPARTMENT_OTHER)
Admission: EM | Admit: 2018-07-10 | Discharge: 2018-07-11 | Disposition: A | Discharge status: Home / Self Care | Payer: Medicare Other | Attending: General Surgery | Admitting: General Surgery

## 2018-07-10 ENCOUNTER — Other Ambulatory Visit: Payer: Self-pay

## 2018-07-10 DIAGNOSIS — Z79899 Other long term (current) drug therapy: Secondary | ICD-10-CM | POA: Diagnosis not present

## 2018-07-10 DIAGNOSIS — K388 Other specified diseases of appendix: Secondary | ICD-10-CM | POA: Diagnosis not present

## 2018-07-10 DIAGNOSIS — K3580 Unspecified acute appendicitis: Secondary | ICD-10-CM

## 2018-07-10 DIAGNOSIS — Z9981 Dependence on supplemental oxygen: Secondary | ICD-10-CM | POA: Diagnosis not present

## 2018-07-10 DIAGNOSIS — R109 Unspecified abdominal pain: Secondary | ICD-10-CM | POA: Diagnosis not present

## 2018-07-10 DIAGNOSIS — F319 Bipolar disorder, unspecified: Secondary | ICD-10-CM | POA: Diagnosis not present

## 2018-07-10 DIAGNOSIS — G911 Obstructive hydrocephalus: Secondary | ICD-10-CM | POA: Diagnosis not present

## 2018-07-10 DIAGNOSIS — R569 Unspecified convulsions: Secondary | ICD-10-CM | POA: Insufficient documentation

## 2018-07-10 DIAGNOSIS — J449 Chronic obstructive pulmonary disease, unspecified: Secondary | ICD-10-CM | POA: Diagnosis not present

## 2018-07-10 DIAGNOSIS — F17210 Nicotine dependence, cigarettes, uncomplicated: Secondary | ICD-10-CM | POA: Insufficient documentation

## 2018-07-10 DIAGNOSIS — Z7951 Long term (current) use of inhaled steroids: Secondary | ICD-10-CM | POA: Insufficient documentation

## 2018-07-10 DIAGNOSIS — K37 Unspecified appendicitis: Secondary | ICD-10-CM | POA: Diagnosis present

## 2018-07-10 DIAGNOSIS — G43909 Migraine, unspecified, not intractable, without status migrainosus: Secondary | ICD-10-CM | POA: Diagnosis not present

## 2018-07-10 DIAGNOSIS — I10 Essential (primary) hypertension: Secondary | ICD-10-CM | POA: Insufficient documentation

## 2018-07-10 DIAGNOSIS — I69354 Hemiplegia and hemiparesis following cerebral infarction affecting left non-dominant side: Secondary | ICD-10-CM | POA: Diagnosis not present

## 2018-07-10 DIAGNOSIS — Z982 Presence of cerebrospinal fluid drainage device: Secondary | ICD-10-CM | POA: Diagnosis not present

## 2018-07-10 DIAGNOSIS — Z79891 Long term (current) use of opiate analgesic: Secondary | ICD-10-CM | POA: Diagnosis not present

## 2018-07-10 LAB — URINALYSIS, MICROSCOPIC (REFLEX)

## 2018-07-10 LAB — DIFFERENTIAL
BASOS ABS: 0 10*3/uL (ref 0.0–0.1)
BASOS PCT: 0 %
Eosinophils Absolute: 0.1 10*3/uL (ref 0.0–0.7)
Eosinophils Relative: 1 %
LYMPHS ABS: 2.3 10*3/uL (ref 0.7–4.0)
Lymphocytes Relative: 42 %
MONOS PCT: 7 %
Monocytes Absolute: 0.4 10*3/uL (ref 0.1–1.0)
Neutro Abs: 2.8 10*3/uL (ref 1.7–7.7)
Neutrophils Relative %: 50 %

## 2018-07-10 LAB — CBC
HEMATOCRIT: 36.1 % (ref 36.0–46.0)
HEMOGLOBIN: 12.2 g/dL (ref 12.0–15.0)
MCH: 30.5 pg (ref 26.0–34.0)
MCHC: 33.8 g/dL (ref 30.0–36.0)
MCV: 90.3 fL (ref 78.0–100.0)
Platelets: 245 10*3/uL (ref 150–400)
RBC: 4 MIL/uL (ref 3.87–5.11)
RDW: 14.6 % (ref 11.5–15.5)
WBC: 5.5 10*3/uL (ref 4.0–10.5)

## 2018-07-10 LAB — URINALYSIS, ROUTINE W REFLEX MICROSCOPIC
Bilirubin Urine: NEGATIVE
Glucose, UA: NEGATIVE mg/dL
Ketones, ur: NEGATIVE mg/dL
Leukocytes, UA: NEGATIVE
Nitrite: NEGATIVE
PH: 5.5 (ref 5.0–8.0)
PROTEIN: NEGATIVE mg/dL
SPECIFIC GRAVITY, URINE: 1.02 (ref 1.005–1.030)

## 2018-07-10 LAB — PREGNANCY, URINE: PREG TEST UR: NEGATIVE

## 2018-07-10 NOTE — ED Triage Notes (Signed)
Pt reports bilateral lower abdominal pain x 4 days. Reports frequent small urination with retention. Denies dysuria.

## 2018-07-10 NOTE — ED Provider Notes (Signed)
[facility] Provider Note: Kelli Dell, MD, FACEP  CSN: [id number] MRN: [id number] ARRIVAL: 07/10/18 at 2317 ROOM: MH01/MH01   CHIEF COMPLAINT  Abdominal Pain   HISTORY OF PRESENT ILLNESS  07/10/18 11:53 PM Kelli Miranda is a 42 y.o. female with a history of ventricular peritoneal shunt status post revision in January of this year.  She is here with a 3-day history of abdominal pain.  The abdominal pain is located in the left lower quadrant in the right lower quadrant.  She describes it as sharp.  She rates it as an 8.5 out of 10.  It had been waxing and waning but became more steady today.  It is worse with movement or palpation.  She denies associated nausea, vomiting, diarrhea or constipation.  She has been urinating smaller amounts than usual.  She has not had a fever.  She denies a headache.  She is status post complete hysterectomy.   Past Medical History:  Diagnosis Date  . Arthritis   . Bipolar disorder (HCC)   . Borderline diabetic   . COPD (chronic obstructive pulmonary disease) (HCC)   . Headache   . Hemiparesis (HCC)    Left  . Hypertension   . Migraines   . Nerve damage    Left-side of the body due to stroke  . Obstructive hydrocephalus   . Pneumonia    recently treated for by C. Charm Barges, PCP, for pneumonia, also had cxr at [facility] in [place].  Finished Prednisone & antibiotic.12/31/2017  . Seizures (HCC)   . Stroke [facility])     Past Surgical History:  Procedure Laterality Date  . ABDOMINAL HYSTERECTOMY    . BRAIN SURGERY    . CHOLECYSTECTOMY    . KNEE SURGERY    . SHUNT REVISION VENTRICULAR-PERITONEAL N/A 01/02/2018   Procedure: SHUNT REVISION  with programmable valve;  Surgeon: [name], MD;  Location: [facility] OR;  Service: Neurosurgery;  Laterality: N/A;  . VENTRICULOPERITONEAL SHUNT      History reviewed. No pertinent family history.  Social History   Tobacco Use  . Smoking status: Current Every Day Smoker    Packs/day:  0.25    Types: Cigarettes    Start date: 01/30/1994  . Smokeless tobacco: Never Used  Substance Use Topics  . Alcohol use: Yes    Alcohol/week: 0.0 oz    Comment: rare  . Drug use: No    Prior to Admission medications   Medication Sig Start Date End Date Taking? Authorizing Provider  ALPRAZolam Prudy Feeler) 0.5 MG tablet Take 0.5 mg by mouth 4 (four) times daily as needed for anxiety.     [provider]  Ascorbic Acid (VITAMIN C) 1000 MG tablet Take 1,000 mg by mouth daily.    [provider]  aspirin EC 81 MG tablet Take 1 tablet (81 mg total) by mouth daily. Patient not taking: Reported on 12/29/2017 10/27/15   [name], MD  Fluticasone-Salmeterol (ADVAIR) 250-50 MCG/DOSE AEPB Inhale 1 puff into the lungs 2 (two) times daily.     [provider]  HYDROcodone-acetaminophen (NORCO/VICODIN) 5-325 MG tablet Take 1 tablet by mouth 2 (two) times daily as needed for moderate pain.     [provider]  HYDROcodone-acetaminophen (NORCO/VICODIN) 5-325 MG tablet Take 1-2 tablets by mouth every 4 (four) hours as needed for moderate pain ((score 4 to 6)). 01/03/18   [name], MD  hydrOXYzine (ATARAX/VISTARIL) 25 MG tablet Take 25 mg by mouth 4 (four) times  daily as needed for anxiety.    [provider]  levETIRAcetam (KEPPRA) 500 MG tablet Take 500 mg by mouth 2 (two) times daily.     [provider]  potassium chloride (K-DUR,KLOR-CON) 10 MEQ tablet Take 10 mEq by mouth 2 (two) times daily.    [provider]  pregabalin (LYRICA) 100 MG capsule Take 200 mg by mouth 3 (three) times daily.     [provider]  promethazine (PHENERGAN) 25 MG tablet TAKE 1 TABLET BY MOUTH EVERY 6 HOURS AS NEEDED FOR NAUSEA Patient taking differently: TAKE 1 TABLET BY MOUTH EVERY 8 HOURS AS NEEDED FOR NAUSEA 11/11/16   Dettinger, Elige Radon, MD  QUEtiapine (SEROQUEL) 100 MG tablet Take 300 mg by mouth at bedtime as needed (sleep).     [provider]  topiramate (TOPAMAX) 200 MG tablet Take 200 mg by mouth 2 (two) times daily.     [provider]  traZODone (DESYREL) 100 MG tablet Take 300 mg by mouth at bedtime as needed for sleep.     [provider]  VENTOLIN HFA 108 (90 Base) MCG/ACT inhaler INHALE 2 PUFFS BY MOUTH EVERY 4 TO 6 HOURS AS NEEDED FOR WHEEZING. 10/11/16   [name], MD  vitamin B-12 500 MCG tablet Take 1 tablet (500 mcg total) by mouth daily. 10/27/15   [name], MD    Allergies Oxycodone-acetaminophen; Iodinated diagnostic agents; Latex; Percocet [oxycodone-acetaminophen]; and Imitrex [sumatriptan]   REVIEW OF SYSTEMS  Negative except as noted here or in the History of Present Illness.   PHYSICAL EXAMINATION  Initial Vital Signs Blood pressure (!) 149/78, pulse 96, temperature 98.6 F (37 C), temperature source Oral, resp. rate 18, height 5\' 1"  (1.549 m), weight 62.6 kg (138 lb), SpO2 99 %.  Examination General: Well-developed, well-nourished female in no acute distress; appears older than age of record HENT: normocephalic; atraumatic; ventriculoperitoneal shunt hardware palpable under skin of scalp and neck Eyes: pupils equal, round and reactive to light; extraocular muscles intact Neck: supple Heart: regular rate and rhythm Lungs: clear to auscultation bilaterally Abdomen: soft; nondistended; left lower quadrant and right lower quadrant tenderness more prominent on the right; no masses or hepatosplenomegaly; bowel sounds present Extremities: No deformity; full range of motion; pulses normal Neurologic: Awake, alert and oriented; left hemiparesis; no facial droop Skin: Warm and dry Psychiatric: Flat affect   RESULTS  Summary of this visit's results, reviewed by myself:   EKG Interpretation  Date/Time:    Ventricular Rate:    PR Interval:    QRS Duration:   QT Interval:    QTC Calculation:   R Axis:     Text Interpretation:        Laboratory  Studies: Results for orders placed or performed during the hospital encounter of 07/10/18 (from the past 24 hour(s))  Urinalysis, Routine w reflex microscopic     Status: Abnormal   Collection Time: 07/10/18 11:32 PM  Result Value Ref Range   Color, Urine YELLOW YELLOW   APPearance CLEAR CLEAR   Specific Gravity, Urine 1.020 1.005 - 1.030   pH 5.5 5.0 - 8.0   Glucose, UA NEGATIVE NEGATIVE mg/dL   Hgb urine dipstick TRACE (A) NEGATIVE   Bilirubin Urine NEGATIVE NEGATIVE   Ketones, ur NEGATIVE NEGATIVE mg/dL   Protein, ur NEGATIVE NEGATIVE mg/dL   Nitrite NEGATIVE NEGATIVE   Leukocytes, UA NEGATIVE NEGATIVE  Pregnancy, urine     Status: None   Collection Time: 07/10/18 11:32 PM  Result Value Ref Range   Preg Test, Ur NEGATIVE NEGATIVE  Urinalysis, Microscopic (reflex)     Status: Abnormal   Collection Time: 07/10/18 11:32 PM  Result Value Ref Range   RBC / HPF 0-5 0 - 5 RBC/hpf   WBC, UA 0-5 0 - 5 WBC/hpf   Bacteria, UA MANY (A) NONE SEEN   Squamous Epithelial / LPF 11-20 0 - 5   Mucus PRESENT   Lipase, blood     Status: None   Collection Time: 07/10/18 11:50 PM  Result Value Ref Range   Lipase 27 11 - 51 U/L  Comprehensive metabolic panel     Status: Abnormal   Collection Time: 07/10/18 11:50 PM  Result Value Ref Range   Sodium 139 135 - 145 mmol/L   Potassium 3.5 3.5 - 5.1 mmol/L   Chloride 110 98 - 111 mmol/L   CO2 23 22 - 32 mmol/L   Glucose, Bld 86 70 - 99 mg/dL   BUN 11 6 - 20 mg/dL   Creatinine, Ser 1.611.11 (H) 0.44 - 1.00 mg/dL   Calcium 9.0 8.9 - 09.610.3 mg/dL   Total Protein 6.6 6.5 - 8.1 g/dL   Albumin 3.9 3.5 - 5.0 g/dL   AST 24 15 - 41 U/L   ALT 27 0 - 44 U/L   Alkaline Phosphatase 77 38 - 126 U/L   Total Bilirubin 0.4 0.3 - 1.2 mg/dL   GFR calc non Af Amer 60 (L) >60 mL/min   GFR calc Af Amer >60 >60 mL/min   Anion gap 6 5 - 15  CBC     Status: None   Collection Time: 07/10/18 11:50 PM  Result Value Ref Range   WBC 5.5 4.0 - 10.5 K/uL   RBC 4.00 3.87 -  5.11 MIL/uL   Hemoglobin 12.2 12.0 - 15.0 g/dL   HCT 04.536.1 40.936.0 - 81.146.0 %   MCV 90.3 78.0 - 100.0 fL   MCH 30.5 26.0 - 34.0 pg   MCHC 33.8 30.0 - 36.0 g/dL   RDW 91.414.6 78.211.5 - 95.615.5 %   Platelets 245 150 - 400 K/uL  Differential     Status: None   Collection Time: 07/10/18 11:50 PM  Result Value Ref Range   Neutrophils Relative % 50 %   Neutro Abs 2.8 1.7 - 7.7 K/uL   Lymphocytes Relative 42 %   Lymphs Abs 2.3 0.7 - 4.0 K/uL   Monocytes Relative 7 %   Monocytes Absolute 0.4 0.1 - 1.0 K/uL   Eosinophils Relative 1 %   Eosinophils Absolute 0.1 0.0 - 0.7 K/uL   Basophils Relative 0 %   Basophils Absolute 0.0 0.0 - 0.1 K/uL   Imaging Studies: Ct Abdomen Pelvis Wo Contrast  Result Date: 07/11/2018 CLINICAL DATA:  Lower abdominal pain and fever EXAM: CT ABDOMEN AND PELVIS WITHOUT CONTRAST TECHNIQUE: Multidetector CT imaging of the abdomen and pelvis was performed following the standard protocol without IV contrast. COMPARISON:  CT abdomen pelvis 07/28/2012 FINDINGS: LOWER CHEST: No basilar pulmonary nodules or pleural effusion. No apical pericardial effusion. HEPATOBILIARY: Normal hepatic contours and density. No intra- or extrahepatic biliary dilatation. Status post cholecystectomy. PANCREAS: Normal parenchymal contours without ductal dilatation. No peripancreatic fluid collection. SPLEEN: Normal. ADRENALS/URINARY TRACT: --Adrenal glands: Normal. --Right kidney/ureter: No hydronephrosis, nephroureterolithiasis, perinephric stranding or solid renal mass. --Left kidney/ureter: No hydronephrosis, nephroureterolithiasis, perinephric stranding or solid renal mass. --Urinary bladder: Normal for degree of distention STOMACH/BOWEL: --Stomach/Duodenum: No hiatal hernia or other gastric abnormality. Normal duodenal  course. --Small bowel: No dilatation or inflammation. --Colon: No focal abnormality. --Appendix: The distal appendix is mildly dilated with a diameter approximately 7 mm. There is mild adjacent  inflammatory stranding. VASCULAR/LYMPHATIC: Normal course and caliber of the major abdominal vessels. No abdominal or pelvic lymphadenopathy. REPRODUCTIVE: Status post hysterectomy. No adnexal mass. MUSCULOSKELETAL. No bony spinal canal stenosis or focal osseous abnormality. OTHER: Ventriculoperitoneal shunt catheter terminates in the left upper quadrant. IMPRESSION: 1. Mildly dilated (7 mm) distal appendix with mild inflammation of the surrounding fat. This may indicate early/tip appendicitis. 2. Ventriculoperitoneal shunt catheter terminates in the left upper quadrant. Electronically Signed   By: Deatra Robinson M.D.   On: 07/11/2018 02:08    ED COURSE and MDM  Nursing notes and initial vitals signs, including pulse oximetry, reviewed.  Vitals:   07/10/18 2326  BP: (!) 149/78  Pulse: 96  Resp: 18  Temp: 98.6 F (37 C)  TempSrc: Oral  SpO2: 99%  Weight: 62.6 kg (138 lb)  Height: 5\' 1"  (1.549 m)   2:28 AM Antibiotics ordered for likely acute appendicitis.  Will contact general surgery and transport to [facility] ED.  3:15 AM Discussed with Dr. Dwain Sarna who advises patient be sent to the [facility] ED as she may need neurosurgical assistance in the treatment of this condition.  Dr. Judd Lien has accepted the patient for transfer to the [facility] ED.  PROCEDURES    ED DIAGNOSES     ICD-10-CM   1. Uncomplicated acute appendicitis K35.80        Charles Andringa, Jonny Ruiz, MD 07/11/18 [phone]

## 2018-07-11 ENCOUNTER — Emergency Department (HOSPITAL_COMMUNITY): Payer: Medicare Other | Admitting: Certified Registered"

## 2018-07-11 ENCOUNTER — Emergency Department (HOSPITAL_BASED_OUTPATIENT_CLINIC_OR_DEPARTMENT_OTHER): Payer: Medicare Other

## 2018-07-11 ENCOUNTER — Encounter (HOSPITAL_COMMUNITY)
Admission: EM | Disposition: A | Discharge status: Home / Self Care | Payer: Self-pay | Source: Home / Self Care | Attending: Emergency Medicine

## 2018-07-11 DIAGNOSIS — K3580 Unspecified acute appendicitis: Secondary | ICD-10-CM | POA: Diagnosis present

## 2018-07-11 DIAGNOSIS — G43909 Migraine, unspecified, not intractable, without status migrainosus: Secondary | ICD-10-CM | POA: Diagnosis not present

## 2018-07-11 DIAGNOSIS — F17210 Nicotine dependence, cigarettes, uncomplicated: Secondary | ICD-10-CM | POA: Diagnosis not present

## 2018-07-11 DIAGNOSIS — Z9981 Dependence on supplemental oxygen: Secondary | ICD-10-CM | POA: Diagnosis not present

## 2018-07-11 DIAGNOSIS — F319 Bipolar disorder, unspecified: Secondary | ICD-10-CM | POA: Diagnosis not present

## 2018-07-11 DIAGNOSIS — Z7951 Long term (current) use of inhaled steroids: Secondary | ICD-10-CM | POA: Diagnosis not present

## 2018-07-11 DIAGNOSIS — G911 Obstructive hydrocephalus: Secondary | ICD-10-CM | POA: Diagnosis not present

## 2018-07-11 DIAGNOSIS — J449 Chronic obstructive pulmonary disease, unspecified: Secondary | ICD-10-CM | POA: Diagnosis not present

## 2018-07-11 DIAGNOSIS — R569 Unspecified convulsions: Secondary | ICD-10-CM | POA: Diagnosis not present

## 2018-07-11 DIAGNOSIS — Z79899 Other long term (current) drug therapy: Secondary | ICD-10-CM | POA: Diagnosis not present

## 2018-07-11 DIAGNOSIS — K37 Unspecified appendicitis: Secondary | ICD-10-CM | POA: Diagnosis present

## 2018-07-11 DIAGNOSIS — Z79891 Long term (current) use of opiate analgesic: Secondary | ICD-10-CM | POA: Diagnosis not present

## 2018-07-11 DIAGNOSIS — K388 Other specified diseases of appendix: Secondary | ICD-10-CM | POA: Diagnosis not present

## 2018-07-11 DIAGNOSIS — R109 Unspecified abdominal pain: Secondary | ICD-10-CM | POA: Diagnosis not present

## 2018-07-11 DIAGNOSIS — I10 Essential (primary) hypertension: Secondary | ICD-10-CM | POA: Diagnosis not present

## 2018-07-11 DIAGNOSIS — I69354 Hemiplegia and hemiparesis following cerebral infarction affecting left non-dominant side: Secondary | ICD-10-CM | POA: Diagnosis not present

## 2018-07-11 DIAGNOSIS — Z982 Presence of cerebrospinal fluid drainage device: Secondary | ICD-10-CM | POA: Diagnosis not present

## 2018-07-11 HISTORY — PX: LAPAROSCOPIC APPENDECTOMY: SHX408

## 2018-07-11 LAB — COMPREHENSIVE METABOLIC PANEL
ALBUMIN: 3.9 g/dL (ref 3.5–5.0)
ALT: 27 U/L (ref 0–44)
ANION GAP: 6 (ref 5–15)
AST: 24 U/L (ref 15–41)
Alkaline Phosphatase: 77 U/L (ref 38–126)
BILIRUBIN TOTAL: 0.4 mg/dL (ref 0.3–1.2)
BUN: 11 mg/dL (ref 6–20)
CHLORIDE: 110 mmol/L (ref 98–111)
CO2: 23 mmol/L (ref 22–32)
CREATININE: 1.11 mg/dL — AB (ref 0.44–1.00)
Calcium: 9 mg/dL (ref 8.9–10.3)
GFR calc Af Amer: >60 mL/min (ref >60)
GFR calc non Af Amer: 60 mL/min — ABNORMAL LOW (ref >60)
GLUCOSE: 86 mg/dL (ref 70–99)
POTASSIUM: 3.5 mmol/L (ref 3.5–5.1)
SODIUM: 139 mmol/L (ref 135–145)
Total Protein: 6.6 g/dL (ref 6.5–8.1)

## 2018-07-11 LAB — LIPASE, BLOOD: Lipase: 27 U/L (ref 11–51)

## 2018-07-11 LAB — GLUCOSE, CAPILLARY: GLUCOSE-CAPILLARY: 126 mg/dL — AB (ref 70–99)

## 2018-07-11 SURGERY — APPENDECTOMY, LAPAROSCOPIC
Anesthesia: General | Site: Abdomen

## 2018-07-11 MED ORDER — DIPHENHYDRAMINE HCL 50 MG/ML IJ SOLN: 12.5000 mg | Freq: Four times a day (QID) | INTRAMUSCULAR | Status: DC | PRN

## 2018-07-11 MED ORDER — FENTANYL CITRATE (PF) 100 MCG/2ML IJ SOLN
100.0000 ug | Freq: Once | INTRAMUSCULAR | Status: AC
  Administered 2018-07-11: 100 ug via INTRAVENOUS
  Filled 2018-07-11: qty 2

## 2018-07-11 MED ORDER — MIDAZOLAM HCL 2 MG/2ML IJ SOLN
INTRAMUSCULAR | Status: AC
  Filled 2018-07-11: qty 2

## 2018-07-11 MED ORDER — LACTATED RINGERS IV SOLN
INTRAVENOUS | Status: DC | PRN
  Administered 2018-07-11 (×2): via INTRAVENOUS

## 2018-07-11 MED ORDER — ACETAMINOPHEN 500 MG PO TABS
1000.0000 mg | ORAL_TABLET | Freq: Four times a day (QID) | ORAL | Status: DC
  Administered 2018-07-11: 1000 mg via ORAL
  Filled 2018-07-11: qty 2

## 2018-07-11 MED ORDER — QUETIAPINE FUMARATE 300 MG PO TABS
300.0000 mg | ORAL_TABLET | Freq: Every evening | ORAL | Status: DC | PRN
  Filled 2018-07-11: qty 1

## 2018-07-11 MED ORDER — ONDANSETRON HCL 4 MG/2ML IJ SOLN
4.0000 mg | Freq: Once | INTRAMUSCULAR | Status: AC
  Administered 2018-07-11: 4 mg via INTRAVENOUS
  Filled 2018-07-11: qty 2

## 2018-07-11 MED ORDER — KETOROLAC TROMETHAMINE 30 MG/ML IJ SOLN
INTRAMUSCULAR | Status: DC | PRN
  Administered 2018-07-11: 30 mg via INTRAVENOUS

## 2018-07-11 MED ORDER — SUGAMMADEX SODIUM 200 MG/2ML IV SOLN
INTRAVENOUS | Status: AC
  Filled 2018-07-11: qty 2

## 2018-07-11 MED ORDER — TOPIRAMATE 100 MG PO TABS
200.0000 mg | ORAL_TABLET | Freq: Two times a day (BID) | ORAL | Status: DC
  Administered 2018-07-11: 200 mg via ORAL
  Filled 2018-07-11: qty 2

## 2018-07-11 MED ORDER — PREGABALIN 75 MG PO CAPS
200.0000 mg | ORAL_CAPSULE | Freq: Three times a day (TID) | ORAL | Status: DC
  Filled 2018-07-11: qty 2

## 2018-07-11 MED ORDER — PROPOFOL 10 MG/ML IV BOLUS
INTRAVENOUS | Status: AC
  Filled 2018-07-11: qty 20

## 2018-07-11 MED ORDER — ONDANSETRON HCL 4 MG/2ML IJ SOLN
INTRAMUSCULAR | Status: DC | PRN
  Administered 2018-07-11: 4 mg via INTRAVENOUS

## 2018-07-11 MED ORDER — FENTANYL CITRATE (PF) 100 MCG/2ML IJ SOLN
INTRAMUSCULAR | Status: DC | PRN
  Administered 2018-07-11 (×2): 100 ug via INTRAVENOUS
  Administered 2018-07-11: 50 ug via INTRAVENOUS

## 2018-07-11 MED ORDER — LIDOCAINE HCL (CARDIAC) PF 100 MG/5ML IV SOSY
PREFILLED_SYRINGE | INTRAVENOUS | Status: DC | PRN
  Administered 2018-07-11: 60 mg via INTRAVENOUS

## 2018-07-11 MED ORDER — DIPHENHYDRAMINE HCL 12.5 MG/5ML PO ELIX: 12.5000 mg | ORAL_SOLUTION | Freq: Four times a day (QID) | ORAL | Status: DC | PRN

## 2018-07-11 MED ORDER — PREGABALIN 100 MG PO CAPS: 200.0000 mg | ORAL_CAPSULE | Freq: Three times a day (TID) | ORAL | Status: DC

## 2018-07-11 MED ORDER — DEXAMETHASONE SODIUM PHOSPHATE 10 MG/ML IJ SOLN
INTRAMUSCULAR | Status: DC | PRN
  Administered 2018-07-11: 10 mg via INTRAVENOUS

## 2018-07-11 MED ORDER — SODIUM CHLORIDE 0.9 % IV SOLN
2.0000 g | Freq: Once | INTRAVENOUS | Status: AC
  Administered 2018-07-11: 2 g via INTRAVENOUS
  Filled 2018-07-11: qty 20

## 2018-07-11 MED ORDER — ENOXAPARIN SODIUM 40 MG/0.4ML ~~LOC~~ SOLN: 40.0000 mg | SUBCUTANEOUS | Status: DC

## 2018-07-11 MED ORDER — MOMETASONE FURO-FORMOTEROL FUM 200-5 MCG/ACT IN AERO
2.0000 | INHALATION_SPRAY | Freq: Two times a day (BID) | RESPIRATORY_TRACT | Status: DC
  Filled 2018-07-11: qty 8.8

## 2018-07-11 MED ORDER — KETOROLAC TROMETHAMINE 30 MG/ML IJ SOLN
30.0000 mg | Freq: Three times a day (TID) | INTRAMUSCULAR | Status: DC | PRN
  Administered 2018-07-11: 30 mg via INTRAVENOUS
  Filled 2018-07-11: qty 1

## 2018-07-11 MED ORDER — FENTANYL CITRATE (PF) 100 MCG/2ML IJ SOLN
50.0000 ug | INTRAMUSCULAR | Status: DC | PRN
  Administered 2018-07-11: 50 ug via INTRAVENOUS
  Filled 2018-07-11: qty 2

## 2018-07-11 MED ORDER — PROPOFOL 10 MG/ML IV BOLUS
INTRAVENOUS | Status: DC | PRN
  Administered 2018-07-11: 150 mg via INTRAVENOUS

## 2018-07-11 MED ORDER — ONDANSETRON HCL 4 MG/2ML IJ SOLN
INTRAMUSCULAR | Status: AC
  Filled 2018-07-11: qty 2

## 2018-07-11 MED ORDER — KCL IN DEXTROSE-NACL 20-5-0.45 MEQ/L-%-% IV SOLN: INTRAVENOUS | Status: DC

## 2018-07-11 MED ORDER — METRONIDAZOLE IN NACL 5-0.79 MG/ML-% IV SOLN
500.0000 mg | Freq: Once | INTRAVENOUS | Status: AC
  Administered 2018-07-11: 500 mg via INTRAVENOUS
  Filled 2018-07-11: qty 100

## 2018-07-11 MED ORDER — ROCURONIUM BROMIDE 10 MG/ML (PF) SYRINGE
PREFILLED_SYRINGE | INTRAVENOUS | Status: AC
  Filled 2018-07-11: qty 10

## 2018-07-11 MED ORDER — SODIUM CHLORIDE 0.9 % IV SOLN
Freq: Once | INTRAVENOUS | Status: AC
  Administered 2018-07-11: via INTRAVENOUS

## 2018-07-11 MED ORDER — SUCCINYLCHOLINE CHLORIDE 20 MG/ML IJ SOLN
INTRAMUSCULAR | Status: DC | PRN
  Administered 2018-07-11: 80 mg via INTRAVENOUS

## 2018-07-11 MED ORDER — PANTOPRAZOLE SODIUM 40 MG PO TBEC
40.0000 mg | DELAYED_RELEASE_TABLET | Freq: Every day | ORAL | Status: DC
  Administered 2018-07-11: 40 mg via ORAL
  Filled 2018-07-11: qty 1

## 2018-07-11 MED ORDER — FENTANYL CITRATE (PF) 100 MCG/2ML IJ SOLN: 25.0000 ug | INTRAMUSCULAR | Status: DC | PRN

## 2018-07-11 MED ORDER — ONDANSETRON 4 MG PO TBDP: 4.0000 mg | ORAL_TABLET | Freq: Four times a day (QID) | ORAL | Status: DC | PRN

## 2018-07-11 MED ORDER — 0.9 % SODIUM CHLORIDE (POUR BTL) OPTIME
TOPICAL | Status: DC | PRN
  Administered 2018-07-11: 1000 mL

## 2018-07-11 MED ORDER — DEXAMETHASONE SODIUM PHOSPHATE 10 MG/ML IJ SOLN
INTRAMUSCULAR | Status: AC
  Filled 2018-07-11: qty 1

## 2018-07-11 MED ORDER — BARIUM SULFATE 2 % PO SUSP
450.0000 mL | Freq: Once | ORAL | Status: AC
  Administered 2018-07-11: 450 mL via ORAL

## 2018-07-11 MED ORDER — ALPRAZOLAM 0.5 MG PO TABS: 0.5000 mg | ORAL_TABLET | Freq: Three times a day (TID) | ORAL | Status: DC | PRN

## 2018-07-11 MED ORDER — SUGAMMADEX SODIUM 200 MG/2ML IV SOLN
INTRAVENOUS | Status: DC | PRN
  Administered 2018-07-11: 130 mg via INTRAVENOUS

## 2018-07-11 MED ORDER — MIDAZOLAM HCL 5 MG/5ML IJ SOLN
INTRAMUSCULAR | Status: DC | PRN
  Administered 2018-07-11: 2 mg via INTRAVENOUS

## 2018-07-11 MED ORDER — KETOROLAC TROMETHAMINE 30 MG/ML IJ SOLN
INTRAMUSCULAR | Status: AC
  Filled 2018-07-11: qty 1

## 2018-07-11 MED ORDER — FENTANYL CITRATE (PF) 250 MCG/5ML IJ SOLN
INTRAMUSCULAR | Status: AC
  Filled 2018-07-11: qty 5

## 2018-07-11 MED ORDER — BUPIVACAINE-EPINEPHRINE 0.25% -1:200000 IJ SOLN
INTRAMUSCULAR | Status: DC | PRN
  Administered 2018-07-11: 30 mL

## 2018-07-11 MED ORDER — CALCIUM CHLORIDE 10 % IV SOLN
INTRAVENOUS | Status: DC | PRN
  Administered 2018-07-11 (×2): 100 mg via INTRAVENOUS

## 2018-07-11 MED ORDER — SODIUM CHLORIDE 0.9 % IR SOLN
Status: DC | PRN
  Administered 2018-07-11: 1000 mL

## 2018-07-11 MED ORDER — LEVETIRACETAM 500 MG PO TABS
500.0000 mg | ORAL_TABLET | Freq: Two times a day (BID) | ORAL | Status: DC
  Administered 2018-07-11: 500 mg via ORAL
  Filled 2018-07-11: qty 1

## 2018-07-11 MED ORDER — SIMETHICONE 80 MG PO CHEW: 40.0000 mg | CHEWABLE_TABLET | Freq: Four times a day (QID) | ORAL | Status: DC | PRN

## 2018-07-11 MED ORDER — ONDANSETRON HCL 4 MG/2ML IJ SOLN: 4.0000 mg | Freq: Four times a day (QID) | INTRAMUSCULAR | Status: DC | PRN

## 2018-07-11 MED ORDER — BUPIVACAINE-EPINEPHRINE (PF) 0.25% -1:200000 IJ SOLN
INTRAMUSCULAR | Status: AC
  Filled 2018-07-11: qty 30

## 2018-07-11 MED ORDER — SODIUM CHLORIDE 0.9 % IV SOLN
INTRAVENOUS | Status: DC | PRN
  Administered 2018-07-11: 50 ug/min via INTRAVENOUS

## 2018-07-11 MED ORDER — DOCUSATE SODIUM 100 MG PO CAPS: 100.0000 mg | ORAL_CAPSULE | Freq: Two times a day (BID) | ORAL | Status: DC

## 2018-07-11 MED ORDER — ROCURONIUM BROMIDE 100 MG/10ML IV SOLN
INTRAVENOUS | Status: DC | PRN
  Administered 2018-07-11: 50 mg via INTRAVENOUS

## 2018-07-11 MED ORDER — TRAZODONE HCL 150 MG PO TABS: 300.0000 mg | ORAL_TABLET | Freq: Every evening | ORAL | Status: DC | PRN

## 2018-07-11 MED ORDER — LIDOCAINE 2% (20 MG/ML) 5 ML SYRINGE
INTRAMUSCULAR | Status: AC
  Filled 2018-07-11: qty 5

## 2018-07-11 MED ORDER — PHENYLEPHRINE HCL 10 MG/ML IJ SOLN
INTRAMUSCULAR | Status: DC | PRN
  Administered 2018-07-11: 240 ug via INTRAVENOUS
  Administered 2018-07-11: 160 ug via INTRAVENOUS

## 2018-07-11 MED ORDER — TOPIRAMATE 100 MG PO TABS: 200.0000 mg | ORAL_TABLET | Freq: Two times a day (BID) | ORAL | Status: DC

## 2018-07-11 SURGICAL SUPPLY — 54 items
APPLIER CLIP ROT 10 11.4 M/L (STAPLE) ×3
BANDAGE ADH SHEER 1  50/CT (GAUZE/BANDAGES/DRESSINGS) IMPLANT
BENZOIN TINCTURE PRP APPL 2/3 (GAUZE/BANDAGES/DRESSINGS) ×3 IMPLANT
BLADE CLIPPER SURG (BLADE) ×3 IMPLANT
BNDG COHESIVE 2X5 WHT NS (GAUZE/BANDAGES/DRESSINGS) ×3 IMPLANT
CANISTER SUCT 3000ML PPV (MISCELLANEOUS) ×3 IMPLANT
CHLORAPREP W/TINT 26ML (MISCELLANEOUS) ×6 IMPLANT
CLIP APPLIE ROT 10 11.4 M/L (STAPLE) ×1 IMPLANT
CLOSURE WOUND 1/2 X4 (GAUZE/BANDAGES/DRESSINGS) ×1
COVER SURGICAL LIGHT HANDLE (MISCELLANEOUS) ×3 IMPLANT
CUTTER FLEX LINEAR 45M (STAPLE) ×3 IMPLANT
DERMABOND ADVANCED (GAUZE/BANDAGES/DRESSINGS)
DERMABOND ADVANCED .7 DNX12 (GAUZE/BANDAGES/DRESSINGS) IMPLANT
DRSG TEGADERM 2-3/8X2-3/4 SM (GAUZE/BANDAGES/DRESSINGS) ×3 IMPLANT
DRSG TEGADERM 4X4.75 (GAUZE/BANDAGES/DRESSINGS) IMPLANT
ELECT REM PT RETURN 9FT ADLT (ELECTROSURGICAL) ×3
ELECTRODE REM PT RTRN 9FT ADLT (ELECTROSURGICAL) ×1 IMPLANT
ENDOLOOP SUT PDS II  0 18 (SUTURE)
ENDOLOOP SUT PDS II 0 18 (SUTURE) IMPLANT
GAUZE SPONGE 2X2 8PLY STRL LF (GAUZE/BANDAGES/DRESSINGS) ×1 IMPLANT
GLOVE BIOGEL M STRL SZ7.5 (GLOVE) IMPLANT
GLOVE BIOGEL PI IND STRL 8 (GLOVE) ×2 IMPLANT
GLOVE BIOGEL PI INDICATOR 8 (GLOVE) ×4
GLOVE SKINSENSE NS SZ8.0 LF (GLOVE) ×2
GLOVE SKINSENSE STRL SZ8.0 LF (GLOVE) ×1 IMPLANT
GOWN STRL REUS W/ TWL LRG LVL3 (GOWN DISPOSABLE) ×2 IMPLANT
GOWN STRL REUS W/TWL 2XL LVL3 (GOWN DISPOSABLE) ×3 IMPLANT
GOWN STRL REUS W/TWL LRG LVL3 (GOWN DISPOSABLE) ×4
GRASPER SUT TROCAR 14GX15 (MISCELLANEOUS) IMPLANT
KIT BASIN OR (CUSTOM PROCEDURE TRAY) ×3 IMPLANT
KIT TURNOVER KIT B (KITS) ×3 IMPLANT
NS IRRIG 1000ML POUR BTL (IV SOLUTION) ×3 IMPLANT
PAD ARMBOARD 7.5X6 YLW CONV (MISCELLANEOUS) ×6 IMPLANT
POUCH RETRIEVAL ECOSAC 10 (ENDOMECHANICALS) ×1 IMPLANT
POUCH RETRIEVAL ECOSAC 10MM (ENDOMECHANICALS) ×2
RELOAD 45 VASCULAR/THIN (ENDOMECHANICALS) ×6 IMPLANT
RELOAD STAPLE TA45 3.5 REG BLU (ENDOMECHANICALS) IMPLANT
SCISSORS LAP 5X35 DISP (ENDOMECHANICALS) IMPLANT
SET IRRIG TUBING LAPAROSCOPIC (IRRIGATION / IRRIGATOR) ×3 IMPLANT
SHEARS HARMONIC ACE PLUS 36CM (ENDOMECHANICALS) ×3 IMPLANT
SLEEVE ENDOPATH XCEL 5M (ENDOMECHANICALS) ×3 IMPLANT
SPECIMEN JAR SMALL (MISCELLANEOUS) ×3 IMPLANT
SPONGE GAUZE 2X2 STER 10/PKG (GAUZE/BANDAGES/DRESSINGS) ×2
STRIP CLOSURE SKIN 1/2X4 (GAUZE/BANDAGES/DRESSINGS) ×2 IMPLANT
SUT MNCRL AB 4-0 PS2 18 (SUTURE) ×3 IMPLANT
SUT VICRYL 0 UR6 27IN ABS (SUTURE) ×3 IMPLANT
TOWEL OR 17X24 6PK STRL BLUE (TOWEL DISPOSABLE) ×3 IMPLANT
TOWEL OR 17X26 10 PK STRL BLUE (TOWEL DISPOSABLE) ×3 IMPLANT
TRAY FOLEY CATH SILVER 16FR (SET/KITS/TRAYS/PACK) ×3 IMPLANT
TRAY LAPAROSCOPIC MC (CUSTOM PROCEDURE TRAY) ×3 IMPLANT
TROCAR XCEL BLUNT TIP 100MML (ENDOMECHANICALS) ×3 IMPLANT
TROCAR XCEL NON-BLD 5MMX100MML (ENDOMECHANICALS) ×3 IMPLANT
TUBING INSUFFLATION (TUBING) ×3 IMPLANT
WATER STERILE IRR 1000ML POUR (IV SOLUTION) ×3 IMPLANT

## 2018-07-11 NOTE — Progress Notes (Signed)
Pt anxious and wants to go smoke and get belongings from downstairs.  Insisted on having IV saline locked.

## 2018-07-11 NOTE — Anesthesia Procedure Notes (Signed)
Procedure Name: Intubation Date/Time: 07/11/2018 8:32 AM Performed by: Rosiland OzMeyers, Daphane Odekirk, CRNA Pre-anesthesia Checklist: Patient identified, Emergency Drugs available, Suction available, Patient being monitored and Timeout performed Patient Re-evaluated:Patient Re-evaluated prior to induction Oxygen Delivery Method: Circle system utilized Preoxygenation: Pre-oxygenation with 100% oxygen Induction Type: IV induction and Rapid sequence Laryngoscope Size: Miller and 3 Grade View: Grade I Tube type: Oral Tube size: 7.0 mm Number of attempts: 1 Airway Equipment and Method: Stylet Placement Confirmation: ETT inserted through vocal cords under direct vision,  positive ETCO2 and breath sounds checked- equal and bilateral Secured at: 21 cm Tube secured with: Tape Dental Injury: Teeth and Oropharynx as per pre-operative assessment

## 2018-07-11 NOTE — Progress Notes (Signed)
Late entry. Money and jewelry taken to security. 1 Bag belongings taken to PACU

## 2018-07-11 NOTE — Progress Notes (Signed)
Pt discharged to home with family.  DC instructions given and explained.  No rx needed.  Pt understands follow up appt and what # to call if she has any problems.

## 2018-07-11 NOTE — Progress Notes (Signed)
Pt admitted to 6N01 from PACU s/p Lap appe.  Pt ambulated to BR and voided.

## 2018-07-11 NOTE — H&P (Signed)
Kelli Miranda is an 42 y.o. female.   Chief Complaint: abdominal pain HPI: This is a 42 yo female who presents with a 3 day history of abdominal pain in her lower abdomen.  No nausea, vomiting, diarrhea, constipation, or fever.  She presented to [facility] for evaluation and was found to have early appendicitis.  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 Vertell Limber.  No current headache.   The patient was transported to [facility] for further treatment and evaluation.  Past Medical History:  Diagnosis Date  . Arthritis   . Bipolar disorder ([place])   . Borderline diabetic   . COPD (chronic obstructive pulmonary disease) ([place])   . Headache   . Hemiparesis ([place])    Left  . Hypertension   . Migraines   . Nerve damage    Left-side of the body due to stroke  . Obstructive hydrocephalus   . Pneumonia    recently treated for by C. Melina Copa, PCP, for pneumonia, also had cxr at [facility]. in [place].  Finished Prednisone & antibiotic.12/31/2017  . Seizures ([place])   . Stroke [facility])     Past Surgical History:  Procedure Laterality Date  . ABDOMINAL HYSTERECTOMY    . BRAIN SURGERY    . CHOLECYSTECTOMY    . KNEE SURGERY    . SHUNT REVISION VENTRICULAR-PERITONEAL N/A 01/02/2018   Procedure: SHUNT REVISION  with programmable valve;  Surgeon: Erline Levine, MD;  Location: [place];  Service: Neurosurgery;  Laterality: N/A;  . VENTRICULOPERITONEAL SHUNT      History reviewed. No pertinent family history. Social History:  reports that she has been smoking cigarettes.  She started smoking about 24 years ago. She has been smoking about 0.25 packs per day. She has never used smokeless tobacco. She reports that she drinks alcohol. She reports that she does not use drugs.  Allergies:  Allergies  Allergen Reactions  . Oxycodone-Acetaminophen Other (See Comments)    Facial Edema (intolerance)  . Iodinated Diagnostic Agents Other (See Comments)    Unknown  per pt  . Latex Hives  . Percocet [Oxycodone-Acetaminophen] Swelling    Face swelling   . Imitrex [Sumatriptan] Hives and Palpitations    Prior to Admission medications   Medication Sig Start Date End Date Taking? Authorizing Provider  Ascorbic Acid (VITAMIN C) 1000 MG tablet Take 1,000 mg by mouth daily.   Yes [provider]  fenofibrate 160 MG tablet Take 160 mg by mouth daily. 06/29/18  Yes [provider]  Fluticasone-Salmeterol (ADVAIR) 250-50 MCG/DOSE AEPB Inhale 1 puff into the lungs 2 (two) times daily.    Yes [provider]  HYDROcodone-acetaminophen (NORCO/VICODIN) 5-325 MG tablet Take 1 tablet by mouth 2 (two) times daily as needed for moderate pain.    Yes [provider]  levETIRAcetam (KEPPRA) 500 MG tablet Take 500 mg by mouth 2 (two) times daily.    Yes [provider]  potassium chloride (K-DUR,KLOR-CON) 10 MEQ tablet Take 10 mEq by mouth 2 (two) times daily.   Yes [provider]  pregabalin (LYRICA) 100 MG capsule Take 200 mg by mouth 3 (three) times daily.    Yes [provider]  promethazine (PHENERGAN) 25 MG tablet TAKE 1 TABLET BY MOUTH EVERY 6 HOURS AS NEEDED FOR NAUSEA Patient taking differently: TAKE 1 TABLET BY MOUTH EVERY 8 HOURS AS NEEDED FOR NAUSEA 11/11/16  Yes Dettinger, Fransisca Kaufmann,  MD  QUEtiapine (SEROQUEL) 100 MG tablet Take 300 mg by mouth at bedtime as needed (sleep).    Yes [provider]  topiramate (TOPAMAX) 200 MG tablet Take 200 mg by mouth 2 (two) times daily.    Yes [provider]  traZODone (DESYREL) 100 MG tablet Take 300 mg by mouth at bedtime as needed for sleep.    Yes [provider]  VENTOLIN HFA 108 (90 Base) MCG/ACT inhaler INHALE 2 PUFFS BY MOUTH EVERY 4 TO 6 HOURS AS NEEDED FOR WHEEZING. 10/11/16  Yes Stacks, Cletus Gash, MD  vitamin B-12 500 MCG tablet Take 1 tablet (500 mcg total) by mouth daily. 10/27/15  Yes Tat, Shanon Brow, MD  ALPRAZolam Duanne Moron) 0.5 MG  tablet Take 0.5 mg by mouth 3 (three) times daily as needed for anxiety.     [provider]  aspirin EC 81 MG tablet Take 1 tablet (81 mg total) by mouth daily. Patient not taking: Reported on 12/29/2017 10/27/15   Orson Eva, MD  HYDROcodone-acetaminophen (NORCO/VICODIN) 5-325 MG tablet Take 1-2 tablets by mouth every 4 (four) hours as needed for moderate pain ((score 4 to 6)). Patient not taking: Reported on 07/11/2018 01/03/18   Erline Levine, MD     Results for orders placed or performed during the hospital encounter of 07/10/18 (from the past 48 hour(s))  Urinalysis, Routine w reflex microscopic     Status: Abnormal   Collection Time: 07/10/18 11:32 PM  Result Value Ref Range   Color, Urine YELLOW YELLOW   APPearance CLEAR CLEAR   Specific Gravity, Urine 1.020 1.005 - 1.030   pH 5.5 5.0 - 8.0   Glucose, UA NEGATIVE NEGATIVE mg/dL   Hgb urine dipstick TRACE (A) NEGATIVE   Bilirubin Urine NEGATIVE NEGATIVE   Ketones, ur NEGATIVE NEGATIVE mg/dL   Protein, ur NEGATIVE NEGATIVE mg/dL   Nitrite NEGATIVE NEGATIVE   Leukocytes, UA NEGATIVE NEGATIVE    Comment: Performed at [facility], [address] [place], [place] [address]  Pregnancy, urine     Status: None   Collection Time: 07/10/18 11:32 PM  Result Value Ref Range   Preg Test, Ur NEGATIVE NEGATIVE    Comment:        THE SENSITIVITY OF THIS METHODOLOGY IS >20 mIU/mL. Performed at [facility], [address] [place], [place] [address]   Urinalysis, Microscopic (reflex)     Status: Abnormal   Collection Time: 07/10/18 11:32 PM  Result Value Ref Range   RBC / HPF 0-5 0 - 5 RBC/hpf   WBC, UA 0-5 0 - 5 WBC/hpf   Bacteria, UA MANY (A) NONE SEEN   Squamous Epithelial / LPF 11-20 0 - 5   Mucus PRESENT     Comment: Performed at [facility], [address] [place], [place] [address]  Lipase, blood     Status: None   Collection Time: 07/10/18 11:50 PM  Result Value Ref Range   Lipase  27 11 - 51 U/L    Comment: Performed at [facility], [address] [place], [place] [address]  Comprehensive metabolic panel     Status: Abnormal   Collection Time: 07/10/18 11:50 PM  Result Value Ref Range   Sodium 139 135 - 145 mmol/L   Potassium 3.5 3.5 - 5.1 mmol/L   Chloride 110 98 - 111 mmol/L    Comment: Please note change in reference range.   CO2 23 22 - 32  mmol/L   Glucose, Bld 86 70 - 99 mg/dL    Comment: Please note change in reference range.   BUN 11 6 - 20 mg/dL    Comment: Please note change in reference range.   Creatinine, Ser 1.11 (H) 0.44 - 1.00 mg/dL   Calcium 9.0 8.9 - 10.3 mg/dL   Total Protein 6.6 6.5 - 8.1 g/dL   Albumin 3.9 3.5 - 5.0 g/dL   AST 24 15 - 41 U/L   ALT 27 0 - 44 U/L    Comment: Please note change in reference range.   Alkaline Phosphatase 77 38 - 126 U/L   Total Bilirubin 0.4 0.3 - 1.2 mg/dL   GFR calc non Af Amer 60 (L) >60 mL/min   GFR calc Af Amer >60 >60 mL/min    Comment: (NOTE) The eGFR has been calculated using the CKD EPI equation. This calculation has not been validated in all clinical situations. eGFR's persistently <60 mL/min signify possible Chronic Kidney Disease.    Anion gap 6 5 - 15    Comment: Performed at [facility], [address] [place], [place] [address]  CBC     Status: None   Collection Time: 07/10/18 11:50 PM  Result Value Ref Range   WBC 5.5 4.0 - 10.5 K/uL   RBC 4.00 3.87 - 5.11 MIL/uL   Hemoglobin 12.2 12.0 - 15.0 g/dL   HCT 36.1 36.0 - 46.0 %   MCV 90.3 78.0 - 100.0 fL   MCH 30.5 26.0 - 34.0 pg   MCHC 33.8 30.0 - 36.0 g/dL   RDW 14.6 11.5 - 15.5 %   Platelets 245 150 - 400 K/uL    Comment: Performed at [facility], [address] [place], [place] [address]  Differential     Status: None   Collection Time: 07/10/18 11:50 PM  Result Value Ref Range   Neutrophils Relative % 50 %   Neutro Abs 2.8 1.7 - 7.7 K/uL   Lymphocytes Relative 42 %   Lymphs Abs 2.3 0.7 -  4.0 K/uL   Monocytes Relative 7 %   Monocytes Absolute 0.4 0.1 - 1.0 K/uL   Eosinophils Relative 1 %   Eosinophils Absolute 0.1 0.0 - 0.7 K/uL   Basophils Relative 0 %   Basophils Absolute 0.0 0.0 - 0.1 K/uL    Comment: Performed at [facility], [address] [place], [place] [address]   Ct Abdomen Pelvis Wo Contrast  Result Date: 07/11/2018 CLINICAL DATA:  Lower abdominal pain and fever EXAM: CT ABDOMEN AND PELVIS WITHOUT CONTRAST TECHNIQUE: Multidetector CT imaging of the abdomen and pelvis was performed following the standard protocol without IV contrast. COMPARISON:  CT abdomen pelvis 07/28/2012 FINDINGS: LOWER CHEST: No basilar pulmonary nodules or pleural effusion. No apical pericardial effusion. HEPATOBILIARY: Normal hepatic contours and density. No intra- or extrahepatic biliary dilatation. Status post cholecystectomy. PANCREAS: Normal parenchymal contours without ductal dilatation. No peripancreatic fluid collection. SPLEEN: Normal. ADRENALS/URINARY TRACT: --Adrenal glands: Normal. --Right kidney/ureter: No hydronephrosis, nephroureterolithiasis, perinephric stranding or solid renal mass. --Left kidney/ureter: No hydronephrosis, nephroureterolithiasis, perinephric stranding or solid renal mass. --Urinary bladder: Normal for degree of distention STOMACH/BOWEL: --Stomach/Duodenum: No hiatal hernia or other gastric abnormality. Normal duodenal course. --Small bowel: No dilatation or inflammation. --Colon: No focal abnormality. --Appendix: The distal appendix is mildly dilated with a diameter approximately 7 mm. There is mild adjacent inflammatory stranding. VASCULAR/LYMPHATIC: Normal course and caliber of the major abdominal vessels.  No abdominal or pelvic lymphadenopathy. REPRODUCTIVE: Status post hysterectomy. No adnexal mass. MUSCULOSKELETAL. No bony spinal canal stenosis or focal osseous abnormality. OTHER: Ventriculoperitoneal shunt catheter terminates in the left upper quadrant.  IMPRESSION: 1. Mildly dilated (7 mm) distal appendix with mild inflammation of the surrounding fat. This may indicate early/tip appendicitis. 2. Ventriculoperitoneal shunt catheter terminates in the left upper quadrant. Electronically Signed   By: Ulyses Jarred M.D.   On: 07/11/2018 02:08    Review of Systems  Constitutional: Negative for weight loss.  HENT: Negative for ear discharge, ear pain, hearing loss and tinnitus.   Eyes: Negative for blurred vision, double vision, photophobia and pain.  Respiratory: Negative for cough, sputum production and shortness of breath.   Cardiovascular: Negative for chest pain.  Gastrointestinal: Positive for abdominal pain. Negative for nausea and vomiting.  Genitourinary: Negative for dysuria, flank pain, frequency and urgency.  Musculoskeletal: Negative for back pain, falls, joint pain, myalgias and neck pain.  Neurological: Negative for dizziness, tingling, sensory change, focal weakness, loss of consciousness and headaches.  Endo/Heme/Allergies: Does not bruise/bleed easily.  Psychiatric/Behavioral: Negative for depression, memory loss and substance abuse. The patient is not nervous/anxious.     Blood pressure 114/69, pulse 95, temperature 97.7 F (36.5 C), temperature source Oral, resp. rate 18, height 5' 1"  (1.549 m), weight 62.6 kg (138 lb), SpO2 99 %. Physical Exam  WDWN in NAD Eyes:  Pupils equal, round; sclera anicteric HENT:  Oral mucosa moist; good dentition  Neck:  VP shunt hardware palpated, no thyromegaly Lungs:  CTA bilaterally; normal respiratory effort CV:  Regular rate and rhythm; no murmurs; extremities well-perfused with no edema Abd:  +bowel sounds, soft, tender in both lower quadrants R>L, no palpable organomegaly; no palpable hernias Skin:  Warm, dry; no sign of jaundice Psychiatric - alert and oriented x 4; flat affect  Assessment/Plan Acute appendicitis - no sign of perforation VP shunt - tip is located in LUQ away from  appendix  Laparoscopic appendectomy today by Dr. Redmond Pulling.  The surgical procedure has been discussed with the patient.  Potential risks, benefits, alternative treatments, and expected outcomes have been explained.  All of the patient's questions at this time have been answered.  The likelihood of reaching the patient's treatment goal is good.  The patient understand the proposed surgical procedure and wishes to proceed.   Maia Petties, MD 07/11/2018, 6:32 AM

## 2018-07-11 NOTE — Progress Notes (Signed)
Pt doing well, has ambulated in the hallway, taking tylenol for pain, eating regular diet without nausea.

## 2018-07-11 NOTE — Anesthesia Preprocedure Evaluation (Addendum)
Anesthesia Evaluation  Patient identified by MRN, date of birth, ID band Patient awake    Reviewed: Allergy & Precautions, NPO status , Patient's Chart, lab work & pertinent test results  Airway Mallampati: II  TM Distance: >3 FB Neck ROM: Full    Dental  (+) Edentulous Upper, Edentulous Lower   Pulmonary COPD (on O2 at night),  oxygen dependent, Current Smoker,    Pulmonary exam normal breath sounds clear to auscultation       Cardiovascular hypertension, Normal cardiovascular exam Rhythm:Regular Rate:Normal     Neuro/Psych  Headaches, Seizures -, Well Controlled,  PSYCHIATRIC DISORDERS Bipolar Disorder Obstructive hydrocephalus CVA (Left hemiparesis ), Residual Symptoms    GI/Hepatic negative GI ROS, Neg liver ROS,   Endo/Other  negative endocrine ROS  Renal/GU negative Renal ROS     Musculoskeletal negative musculoskeletal ROS (+)   Abdominal   Peds  Hematology negative hematology ROS (+)   Anesthesia Other Findings appendicitis  Reproductive/Obstetrics hcg negative                            Anesthesia Physical Anesthesia Plan  ASA: IV and emergent  Anesthesia Plan: General   Post-op Pain Management:    Induction: Intravenous and Rapid sequence  PONV Risk Score and Plan: 2 and Ondansetron, Dexamethasone, Treatment may vary due to age or medical condition and Midazolam  Airway Management Planned: Oral ETT  Additional Equipment:   Intra-op Plan:   Post-operative Plan: Extubation in OR  Informed Consent: I have reviewed the patients History and Physical, chart, labs and discussed the procedure including the risks, benefits and alternatives for the proposed anesthesia with the patient or authorized representative who has indicated his/her understanding and acceptance.   Dental advisory given  Plan Discussed with: CRNA  Anesthesia Plan Comments:       Anesthesia Quick  Evaluation

## 2018-07-11 NOTE — ED Provider Notes (Signed)
Patient transferred here from [facility] for surgical consultation.  She was found to have possible tip appendicitis.  He was sent here due to recent VP shunt placement by neurosurgery.  Patient appears comfortable.  Her studies were reviewed.  She has no white count but CT is concerning for acute appendicitis.  General surgery will be notified and asked to consult on the patient.   Geoffery Lyonselo, Judy Pollman, MD 07/11/18 [phone]

## 2018-07-11 NOTE — Interval H&P Note (Signed)
History and Physical Interval Note:  07/11/2018 7:44 AM  Kelli Miranda  has presented today for surgery, with the diagnosis of appendicitis  The various methods of treatment have been discussed with the patient and family. After consideration of risks, benefits and other options for treatment, the patient has consented to  Procedure(s): APPENDECTOMY LAPAROSCOPIC (N/A) as a surgical intervention .  The patient's history has been reviewed, patient examined, no change in status, stable for surgery.  I have reviewed the patient's chart and labs.  Questions were answered to the patient's satisfaction.    Pt seen/examined RLQ TTP; no peritonitis Old scars Cardiac ROS negative +tob  We discussed the etiology and management of acute appendicitis. We discussed operative and nonoperative management.  I recommended operative management along with IV antibiotics.  We discussed laparoscopic appendectomy. We discussed the risk and benefits of surgery including but not limited to bleeding, infection, injury to surrounding structures, need to convert to an open procedure, blood clot formation, post operative abscess or wound infection, staple line complications such as leak or bleeding, hernia formation, post operative ileus, need for additional procedures, anesthesia complications, and the typical postoperative course. I explained that the patient should expect a good improvement in their symptoms.  Gaynelle AduEric Katielynn Horan

## 2018-07-11 NOTE — Transfer of Care (Signed)
Immediate Anesthesia Transfer of Care Note  Patient: Kelli Miranda  Procedure(s) Performed: APPENDECTOMY LAPAROSCOPIC (N/A Abdomen)  Patient Location: PACU  Anesthesia Type:General  Level of Consciousness: awake and patient cooperative  Airway & Oxygen Therapy: Patient Spontanous Breathing  Post-op Assessment: Report given to RN and Post -op Vital signs reviewed and stable  Post vital signs: Reviewed and stable  Last Vitals:  Vitals Value Taken Time  BP 86/64 07/11/2018  9:44 AM  Temp    Pulse 106 07/11/2018  9:44 AM  Resp 14 07/11/2018  9:44 AM  SpO2 98 % 07/11/2018  9:44 AM  Vitals shown include unvalidated device data.  Last Pain:  Vitals:   07/11/18 0651  TempSrc:   PainSc: 8          Complications: No apparent anesthesia complications

## 2018-07-11 NOTE — Anesthesia Postprocedure Evaluation (Signed)
Anesthesia Post Note  Patient: Kelli Miranda  Procedure(s) Performed: APPENDECTOMY LAPAROSCOPIC (N/A Abdomen)     Patient location during evaluation: PACU Anesthesia Type: General Level of consciousness: awake and alert Pain management: pain level controlled Vital Signs Assessment: post-procedure vital signs reviewed and stable Respiratory status: spontaneous breathing, nonlabored ventilation, respiratory function stable and patient connected to nasal cannula oxygen Cardiovascular status: blood pressure returned to baseline and stable Postop Assessment: no apparent nausea or vomiting Anesthetic complications: no    Last Vitals:  Vitals:   07/11/18 1029 07/11/18 1100  BP: 120/75 139/87  Pulse: 81 73  Resp: 15 17  Temp: (!) 36.3 C 36.6 C  SpO2: 99% 99%    Last Pain:  Vitals:   07/11/18 1333  TempSrc:   PainSc: 3                  Ryan P Ellender

## 2018-07-11 NOTE — Discharge Instructions (Signed)
[facility] °LAPAROSCOPIC SURGERY: POST OP INSTRUCTIONS °Always review your discharge instruction sheet given to you by the facility where your surgery was performed. °IF YOU HAVE DISABILITY OR FAMILY LEAVE FORMS, YOU MUST BRING THEM TO THE OFFICE FOR PROCESSING.   °DO NOT GIVE THEM TO YOUR DOCTOR. ° °PAIN CONTROL ° °1. First take acetaminophen (Tylenol) AND/or ibuprofen (Advil) to control your pain after surgery.  Follow directions on package.  Taking acetaminophen (Tylenol) and/or ibuprofen (Advil) regularly after surgery will help to control your pain and lower the amount of prescription pain medication you may need.  You should not take more than 4,000 mg (4 grams) of acetaminophen (Tylenol) in 24 hours.  You should not take ibuprofen (Advil), aleve, motrin, naprosyn or other NSAIDS if you have a history of stomach ulcers or chronic kidney disease.  °2. A prescription for pain medication may be given to you upon discharge.  Take your pain medication as prescribed, if you still have uncontrolled pain after taking acetaminophen (Tylenol) or ibuprofen (Advil). °3. Use ice packs to help control pain. °4. If you need a refill on your pain medication, please contact your pharmacy.  They will contact our office to request authorization. Prescriptions will not be filled after 5pm or on week-ends. ° °HOME MEDICATIONS °5. Take your usually prescribed medications unless otherwise directed. ° °DIET °6. You should follow a light diet the first few days after arrival home.  Be sure to include lots of fluids daily. Avoid fatty, fried foods.  ° °CONSTIPATION °7. It is common to experience some constipation after surgery and if you are taking pain medication.  Increasing fluid intake and taking a stool softener (such as Colace) will usually help or prevent this problem from occurring.  A mild laxative (Milk of Magnesia or Miralax) should be taken according to package instructions if there are no bowel  movements after 48 hours. ° °WOUND/INCISION CARE °8. Most patients will experience some swelling and bruising in the area of the incisions.  Ice packs will help.  Swelling and bruising can take several days to resolve.  °9. Unless discharge instructions indicate otherwise, follow guidelines below  °a. STERI-STRIPS - you may remove your outer bandages 48 hours after surgery, and you may shower at that time.  You have steri-strips (small skin tapes) in place directly over the incision.  These strips should be left on the skin for 7-10 days.   °b. DERMABOND/SKIN GLUE - you may shower in 24 hours.  The glue will flake off over the next 2-3 weeks. °10. Any sutures or staples will be removed at the office during your follow-up visit. ° °ACTIVITIES °11. You may resume regular (light) daily activities beginning the next day--such as daily self-care, walking, climbing stairs--gradually increasing activities as tolerated.  You may have sexual intercourse when it is comfortable.  Refrain from any heavy lifting or straining until approved by your doctor. °a. You may drive when you are no longer taking prescription pain medication, you can comfortably wear a seatbelt, and you can safely maneuver your car and apply brakes. ° °FOLLOW-UP °12. You should see your doctor in the office for a follow-up appointment approximately 2-3 weeks after your surgery.  You should have been given your post-op/follow-up appointment when your surgery was scheduled.  If you did not receive a post-op/follow-up appointment, make sure that you call for this appointment within a day or two after you arrive home to insure a convenient appointment time. ° °OTHER   INSTRUCTIONS °13.  ° °WHEN TO CALL YOUR DOCTOR: °1. Fever over 101.0 °2. Inability to urinate °3. Continued bleeding from incision. °4. Increased pain, redness, or drainage from the incision. °5. Increasing abdominal pain ° °The clinic staff is available to answer your questions during regular  business hours.  Please don’t hesitate to call and ask to speak to one of the nurses for clinical concerns.  If you have a medical emergency, go to the nearest emergency room or call 911.  A surgeon from [facility] is always on call at the hospital. °[address], [address], [place], [place]  [address] ? [address] [address], [place], [place]   [address] °[phone] ? [phone] ? FAX [phone] °Web site: [email or web address] ° °

## 2018-07-11 NOTE — Progress Notes (Signed)
Pt states she wants to go home.  She has ambulated multiple times around the whole dept with good toleration.  Eating regular diet, no nausea.  Voiding without difficulty.  Dr. Andrey CampanileWilson texted.

## 2018-07-11 NOTE — Op Note (Signed)
Kelli Miranda [id number] 1976-05-14 07/11/2018  Appendectomy, Lap, Procedure Note  Indications: The patient presented with a history of right-sided abdominal pain. A CT revealed findings consistent with acute appendicitis.  Pre-operative Diagnosis: acute appendicitis  Post-operative Diagnosis: Same  Surgeon: Gaynelle Adu MD FACS  Assistants: none  Anesthesia: General endotracheal anesthesia  Procedure Details  The patient was seen again in the Holding Room. The risks, benefits, complications, treatment options, and expected outcomes were discussed with the patient and/or family. The possibilities of perforation of viscus, bleeding, recurrent infection, the need for additional procedures, failure to diagnose a condition, and creating a complication requiring transfusion or operation were discussed. There was concurrence with the proposed plan and informed consent was obtained. The site of surgery was properly noted. The patient was taken to Operating Room, identified as Kelli Miranda and the procedure verified as Appendectomy. A Time Out was held and the above information confirmed.  The patient was placed in the supine position and general anesthesia was induced, along with placement of orogastric tube, SCDs, and a Foley catheter. The abdomen was prepped and draped in a sterile fashion. A 1.5 centimeter infraumbilical incision was made.  The umbilical stalk was elevated, and the midline fascia was incised with a #11 blade.  A Kelly clamp was used to confirm entrance into the peritoneal cavity.  A pursestring suture was passed around the incision with a 0 Vicryl.  A 12mm Hasson was introduced into the abdomen and the tails of the suture were used to hold the Hasson in place.   The pneumoperitoneum was then established to steady pressure of 15 mmHg.  Additional 5 mm cannulas then placed in the left lower quadrant of the abdomen and the suprapubic region under direct visualization. A  careful evaluation of the entire abdomen was carried out. The patient was placed in Trendelenburg and left lateral decubitus position. The small intestines were retracted in the cephalad and left lateral direction away from the pelvis and right lower quadrant. The patient was found to have an inflammed tip appendix that was extending into the pelvis. There was no evidence of perforation. Old surgical staples in right pelvic sidewall from previous gyn surgery.  The appendix was carefully dissected. The appendix was was skeletonized with the harmonic scalpel.   The appendix was divided at its base using an endo-GIA stapler with a white load x2 (took small cuff of cecum). No appendiceal stump was left in place. The appendix was removed from the abdomen with an Endocatch bag through the umbilical port.  There was no evidence of bleeding, leakage, or complication after division of the appendix. Irrigation was also performed and irrigate suctioned from the abdomen as well.  The umbilical port site was closed with the purse string suture. The closure was viewed laparoscopically. There was a residual palpable fascial defect so an additional interrupted 0 vicryl suture was placed using PMI suture passer. No defect or air leak. Local was infiltrated in bilateral lateral abdominal walls as a TAP block.  The trocar site skin wounds were closed with 4-0 Monocryl. Benzoin, steri-strips, bandages were applied to the skin incisions.  Instrument, sponge, and needle counts were correct at the conclusion of the case.   Findings: The appendix was found to be inflamed. There were not signs of necrosis.  There was not perforation. There was not abscess formation.  Estimated Blood Loss:  Minimal         Drains: none  Specimens: appendix         Complications:  None; patient tolerated the procedure well.         Disposition: PACU - hemodynamically stable.         Condition: stable  Kelli SellaEric M. Andrey CampanileWilson, MD,  FACS General, Bariatric, & Minimally Invasive Surgery [facility], [place]

## 2018-07-11 NOTE — ED Notes (Signed)
ED Provider at bedside. 

## 2018-07-11 NOTE — ED Notes (Addendum)
[facility]. NAD.

## 2018-07-13 ENCOUNTER — Encounter (HOSPITAL_COMMUNITY): Payer: Self-pay | Admitting: General Surgery

## 2018-07-20 NOTE — Discharge Summary (Signed)
Physician Discharge Summary  [place] PAONE LKG:[id number] DOB: [birth year] DOA: 07/10/2018  PCP: Octavio Graves, DO  Admit date: 07/10/2018 Discharge date: 07/11/2018  Recommendations for Outpatient Follow-up:    Follow-up Information    Sur[facility], [place]. Schedule an appointment as soon as possible for a visit in 3 week(s).   Specialty:  General Surgery Why:  follow up in [place] clinic at [facility] for a post-op appointment Contact information: [place] [address] [phone]          Discharge Diagnoses:  1. Acute appendicitis 2. COPD 3. Bipolar disorder 4. HTN  Surgical Procedure: lap appy 7/13  Discharge Condition: good Disposition: home  Diet recommendation: soft  Filed Weights   07/10/18 2326  Weight: 62.6 kg (138 lb)    History of present illness:  This is a 42 yo female who presents with a 3 day history of abdominal pain in her lower abdomen.  No nausea, vomiting, diarrhea, constipation, or fever.  She presented to [facility] for evaluation and was found to have early appendicitis.  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 Vertell Limber.  No current headache.   The patient was transported to [facility] for further treatment and evaluation.    Hospital Course:  She underwent uncomplicated lap appy. Later in that day she was ambulating, tolerating a diet, pain well controlled, with stable vitals and was discharged home   Discharge Instructions  Discharge Instructions    Call MD for:   Complete by:  As directed    Temperature >101   Call MD for:  hives   Complete by:  As directed    Call MD for:  persistant dizziness or light-headedness   Complete by:  As directed    Call MD for:  persistant nausea and vomiting   Complete by:  As directed    Call MD for:  redness, tenderness, or signs of infection (pain, swelling, redness, odor or green/yellow discharge around  incision site)   Complete by:  As directed    Call MD for:  severe uncontrolled pain   Complete by:  As directed    Diet - low sodium heart healthy   Complete by:  As directed    Discharge instructions   Complete by:  As directed    See [facility] discharge instructions   Increase activity slowly   Complete by:  As directed      Allergies as of 07/11/2018      Reactions   Iodinated Diagnostic Agents Other (See Comments)   Unknown per pt   Latex Hives   Oxycodone    Facial edema/swelling   Imitrex [sumatriptan] Hives, Palpitations      Medication List    STOP taking these medications   aspirin EC 81 MG tablet     TAKE these medications   ALPRAZolam 0.5 MG tablet Commonly known as:  XANAX Take 0.5 mg by mouth 3 (three) times daily as needed for anxiety.   fenofibrate 160 MG tablet Take 160 mg by mouth daily.   Fluticasone-Salmeterol 250-50 MCG/DOSE Aepb Commonly known as:  ADVAIR Inhale 1 puff into the lungs 2 (two) times daily.   HYDROcodone-acetaminophen 5-325 MG tablet Commonly known as:  NORCO/VICODIN Take 1 tablet by mouth 2 (two) times daily as needed for moderate pain. What changed:  Another medication with the same name was removed. Continue taking this medication,  and follow the directions you see here.   levETIRAcetam 500 MG tablet Commonly known as:  KEPPRA Take 500 mg by mouth 2 (two) times daily.   potassium chloride 10 MEQ tablet Commonly known as:  K-DUR,KLOR-CON Take 10 mEq by mouth 2 (two) times daily.   pregabalin 100 MG capsule Commonly known as:  LYRICA Take 200 mg by mouth 3 (three) times daily.   promethazine 25 MG tablet Commonly known as:  PHENERGAN TAKE 1 TABLET BY MOUTH EVERY 6 HOURS AS NEEDED FOR NAUSEA What changed:    how much to take  how to take this  when to take this  additional instructions   QUEtiapine 100 MG tablet Commonly known as:  SEROQUEL Take 300 mg by mouth at bedtime as needed (sleep).   topiramate 200 MG  tablet Commonly known as:  TOPAMAX Take 200 mg by mouth 2 (two) times daily.   traZODone 100 MG tablet Commonly known as:  DESYREL Take 300 mg by mouth at bedtime as needed for sleep.   VENTOLIN HFA 108 (90 Base) MCG/ACT inhaler Generic drug:  albuterol INHALE 2 PUFFS BY MOUTH EVERY 4 TO 6 HOURS AS NEEDED FOR WHEEZING.   vitamin B-12 500 MCG tablet Commonly known as:  CYANOCOBALAMIN Take 1 tablet (500 mcg total) by mouth daily.   vitamin C 1000 MG tablet Take 1,000 mg by mouth daily.      Follow-up Information    Surgery, [place]. Schedule an appointment as soon as possible for a visit in 3 week(s).   Specialty:  General Surgery Why:  follow up in [place] clinic at [facility] for a post-op appointment Contact information: [place] [address] [phone]            The results of significant diagnostics from this hospitalization (including imaging, microbiology, ancillary and laboratory) are listed below for reference.    Significant Diagnostic Studies: Ct Abdomen Pelvis Wo Contrast  Result Date: 07/11/2018 CLINICAL DATA:  Lower abdominal pain and fever EXAM: CT ABDOMEN AND PELVIS WITHOUT CONTRAST TECHNIQUE: Multidetector CT imaging of the abdomen and pelvis was performed following the standard protocol without IV contrast. COMPARISON:  CT abdomen pelvis 07/28/2012 FINDINGS: LOWER CHEST: No basilar pulmonary nodules or pleural effusion. No apical pericardial effusion. HEPATOBILIARY: Normal hepatic contours and density. No intra- or extrahepatic biliary dilatation. Status post cholecystectomy. PANCREAS: Normal parenchymal contours without ductal dilatation. No peripancreatic fluid collection. SPLEEN: Normal. ADRENALS/URINARY TRACT: --Adrenal glands: Normal. --Right kidney/ureter: No hydronephrosis, nephroureterolithiasis, perinephric stranding or solid renal mass. --Left kidney/ureter: No hydronephrosis, nephroureterolithiasis,  perinephric stranding or solid renal mass. --Urinary bladder: Normal for degree of distention STOMACH/BOWEL: --Stomach/Duodenum: No hiatal hernia or other gastric abnormality. Normal duodenal course. --Small bowel: No dilatation or inflammation. --Colon: No focal abnormality. --Appendix: The distal appendix is mildly dilated with a diameter approximately 7 mm. There is mild adjacent inflammatory stranding. VASCULAR/LYMPHATIC: Normal course and caliber of the major abdominal vessels. No abdominal or pelvic lymphadenopathy. REPRODUCTIVE: Status post hysterectomy. No adnexal mass. MUSCULOSKELETAL. No bony spinal canal stenosis or focal osseous abnormality. OTHER: Ventriculoperitoneal shunt catheter terminates in the left upper quadrant. IMPRESSION: 1. Mildly dilated (7 mm) distal appendix with mild inflammation of the surrounding fat. This may indicate early/tip appendicitis. 2. Ventriculoperitoneal shunt catheter terminates in the left upper quadrant. Electronically Signed   By: Ulyses Jarred M.D.   On: 07/11/2018 02:08    Microbiology: No results found for this or any previous visit (from the  past 240 hour(s)).   Labs: BMET    Component Value Date/Time   NA 139 07/10/2018 2350   NA 138 10/02/2016 1151   K 3.5 07/10/2018 2350   CL 110 07/10/2018 2350   CO2 23 07/10/2018 2350   GLUCOSE 86 07/10/2018 2350   BUN 11 07/10/2018 2350   BUN 9 10/02/2016 1151   CREATININE 1.11 (H) 07/10/2018 2350   CALCIUM 9.0 07/10/2018 2350   GFRNONAA 60 (L) 07/10/2018 2350   GFRAA >60 07/10/2018 2350   CBC    Component Value Date/Time   WBC 5.5 07/10/2018 2350   RBC 4.00 07/10/2018 2350   HGB 12.2 07/10/2018 2350   HGB 12.4 10/02/2016 1151   HCT 36.1 07/10/2018 2350   HCT 37.7 10/02/2016 1151   PLT 245 07/10/2018 2350   PLT 224 10/02/2016 1151   MCV 90.3 07/10/2018 2350   MCV 91 10/02/2016 1151   MCH 30.5 07/10/2018 2350   MCHC 33.8 07/10/2018 2350   RDW 14.6 07/10/2018 2350   RDW 14.7 10/02/2016 1151    LYMPHSABS 2.3 07/10/2018 2350   LYMPHSABS 1.6 10/02/2016 1151   MONOABS 0.4 07/10/2018 2350   EOSABS 0.1 07/10/2018 2350   EOSABS 0.1 10/02/2016 1151   BASOSABS 0.0 07/10/2018 2350   BASOSABS 0.0 10/02/2016 1151   Hepatic Function Latest Ref Rng & Units 07/10/2018 10/02/2016 07/04/2010  Total Protein 6.5 - 8.1 g/dL 6.6 6.3 5.0(L)  Albumin 3.5 - 5.0 g/dL 3.9 4.1 3.1(L)  AST 15 - 41 U/L 24 13 12   ALT 0 - 44 U/L 27 23 15   Alk Phosphatase 38 - 126 U/L 77 108 62  Total Bilirubin 0.3 - 1.2 mg/dL 0.4 0.2 0.3    Active Problems:   Appendicitis   Time coordinating discharge: 15 min  Signed:  Gayland Curry, MD [facility], [place] [phone] 07/20/2018, 3:09 PM

## 2022-11-04 ENCOUNTER — Ambulatory Visit: Payer: Medicare Other

## 2022-11-04 NOTE — Therapy (Incomplete)
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 Charm Barges, PCP, for pneumonia, also had cxr at [facility] in [place].  Finished Prednisone & antibiotic.12/31/2017   Seizures (HCC)    Stroke [facility])    Past Surgical History:  Procedure Laterality Date   ABDOMINAL HYSTERECTOMY     BRAIN SURGERY     CHOLECYSTECTOMY     KNEE SURGERY     LAPAROSCOPIC APPENDECTOMY N/A 07/11/2018   Procedure: APPENDECTOMY LAPAROSCOPIC;  Surgeon: Gaynelle Adu, MD;  Location: [facility] OR;  Service: General;  Laterality: N/A;   SHUNT REVISION VENTRICULAR-PERITONEAL N/A 01/02/2018   Procedure: SHUNT REVISION  with programmable valve;  Surgeon: [name], MD;  Location: [facility] OR;  Service: Neurosurgery;  Laterality: N/A;   VENTRICULOPERITONEAL SHUNT     Patient Active Problem List   Diagnosis Date Noted   Appendicitis 07/11/2018   Malfunction of ventriculo-peritoneal shunt (HCC) 01/02/2018   Hemiparesis affecting left side as late effect of cerebrovascular accident (CVA) (HCC) 10/02/2016   Tobacco use disorder 01/31/2016   Hyperglycemia 10/26/2015   Hypokalemia 10/26/2015    PCP: ***  REFERRING PROVIDER: Council Mechanic, NP   REFERRING DIAG: Lumbago with sciatica, left side   Rationale for Evaluation and Treatment: Rehabilitation  THERAPY DIAG:  No diagnosis found.  ONSET DATE: ***  SUBJECTIVE:                                                                                                                                                                                            SUBJECTIVE STATEMENT: ***  PERTINENT HISTORY:  ***  PAIN:  Are you having pain? {OPRCPAIN:27236}  PRECAUTIONS: {Therapy precautions:24002}  WEIGHT BEARING RESTRICTIONS: {Yes ***/No:24003}  FALLS:  Has patient fallen in last 6 months? {fallsyesno:27318}  LIVING ENVIRONMENT: Lives with: {OPRC lives with:25569::"lives with their family"} Lives in: {Lives in:25570} Stairs: {opstairs:27293} Has following equipment at home: {Assistive devices:23999}  OCCUPATION: ***  PLOF: {PLOF:24004}  PATIENT GOALS: ***  NEXT MD VISIT:   OBJECTIVE:   DIAGNOSTIC FINDINGS:  ***  PATIENT SURVEYS:  {rehab surveys:24030}  SCREENING FOR RED FLAGS: Bowel or bladder incontinence: {Yes/No:304960894} Spinal tumors: {Yes/No:304960894} Cauda equina syndrome: {Yes/No:304960894} Compression fracture: {Yes/No:304960894} Abdominal aneurysm: {Yes/No:304960894}  COGNITION: Overall cognitive status: {cognition:24006}     SENSATION: {sensation:27233}  MUSCLE LENGTH: Hamstrings: Right *** deg; Left *** deg Thomas test: Right *** deg; Left *** deg  POSTURE: {posture:25561}  PALPATION: ***  LUMBAR ROM:   AROM eval  Flexion   Extension   Right lateral flexion   Left lateral flexion   Right rotation   Left rotation    (Blank rows = not tested)  LOWER EXTREMITY ROM:     {AROM/PROM:27142}  Right eval Left eval  Hip flexion    Hip extension    Hip abduction    Hip adduction    Hip internal rotation    Hip external rotation    Knee flexion    Knee extension    Ankle dorsiflexion    Ankle plantarflexion    Ankle inversion    Ankle eversion     (Blank rows = not tested)  LOWER EXTREMITY MMT:    MMT Right eval Left eval  Hip flexion    Hip extension    Hip abduction    Hip adduction    Hip internal rotation    Hip external rotation    Knee flexion    Knee extension    Ankle dorsiflexion    Ankle plantarflexion     Ankle inversion    Ankle eversion     (Blank rows = not tested)  LUMBAR SPECIAL TESTS:  {lumbar special test:25242}  FUNCTIONAL TESTS:  {Functional tests:24029}  GAIT: Distance walked: *** Assistive device utilized: {Assistive devices:23999} Level of assistance: {Levels of assistance:24026} Comments: ***  TODAY'S TREATMENT:                                                                                                                              DATE: ***    PATIENT EDUCATION:  Education details: *** Person educated: {Person educated:25204} Education method: {Education Method:25205} Education comprehension: {Education Comprehension:25206}  HOME EXERCISE PROGRAM: ***  ASSESSMENT:  CLINICAL IMPRESSION: Patient is a 46 y.o. female who was seen today for physical therapy evaluation and treatment for ***.   OBJECTIVE IMPAIRMENTS: {opptimpairments:25111}.   ACTIVITY LIMITATIONS: {activitylimitations:27494}  PARTICIPATION LIMITATIONS: {participationrestrictions:25113}  PERSONAL FACTORS: {Personal factors:25162} are also affecting patient's functional outcome.   REHAB POTENTIAL: {rehabpotential:25112}  CLINICAL DECISION MAKING: {clinical decision making:25114}  EVALUATION COMPLEXITY: {Evaluation complexity:25115}   GOALS: Goals reviewed with patient? {yes/no:20286}  SHORT TERM GOALS: Target date: {follow up:25551}  *** Baseline: Goal status: {GOALSTATUS:25110}  2.  *** Baseline:  Goal status: {GOALSTATUS:25110}  3.  *** Baseline:  Goal status: {GOALSTATUS:25110}  4.  *** Baseline:  Goal status: {GOALSTATUS:25110}  5.  *** Baseline:  Goal status: {GOALSTATUS:25110}  6.  *** Baseline:  Goal status: {GOALSTATUS:25110}  LONG TERM GOALS: Target date: {follow up:25551}  *** Baseline:  Goal status: {GOALSTATUS:25110}  2.  ***  Baseline:  Goal status: {GOALSTATUS:25110}  3.  *** Baseline:  Goal status: {GOALSTATUS:25110}  4.   *** Baseline:  Goal status: {GOALSTATUS:25110}  5.  *** Baseline:  Goal status: {GOALSTATUS:25110}  6.  *** Baseline:  Goal status: {GOALSTATUS:25110}  PLAN:  PT FREQUENCY: {rehab frequency:25116}  PT DURATION: {rehab duration:25117}  PLANNED INTERVENTIONS: {rehab planned interventions:25118::"Therapeutic exercises","Therapeutic activity","Neuromuscular re-education","Balance training","Gait training","Patient/Family education","Self Care","Joint mobilization"}.  PLAN FOR NEXT SESSION: Granville Lewis, PT 11/04/2022, 12:56 PM

## 2023-05-04 ENCOUNTER — Emergency Department (HOSPITAL_COMMUNITY): Payer: 59

## 2023-05-04 ENCOUNTER — Encounter (HOSPITAL_COMMUNITY): Payer: Self-pay

## 2023-05-04 ENCOUNTER — Other Ambulatory Visit: Payer: Self-pay

## 2023-05-04 ENCOUNTER — Emergency Department (HOSPITAL_COMMUNITY)
Admission: EM | Admit: 2023-05-04 | Discharge: 2023-05-04 | Disposition: A | Discharge status: Home / Self Care | Payer: 59 | Attending: Emergency Medicine | Admitting: Emergency Medicine

## 2023-05-04 DIAGNOSIS — I700 Atherosclerosis of aorta: Secondary | ICD-10-CM | POA: Diagnosis not present

## 2023-05-04 DIAGNOSIS — F1910 Other psychoactive substance abuse, uncomplicated: Secondary | ICD-10-CM | POA: Diagnosis not present

## 2023-05-04 DIAGNOSIS — F1320 Sedative, hypnotic or anxiolytic dependence, uncomplicated: Secondary | ICD-10-CM | POA: Diagnosis not present

## 2023-05-04 DIAGNOSIS — F1290 Cannabis use, unspecified, uncomplicated: Secondary | ICD-10-CM | POA: Diagnosis not present

## 2023-05-04 DIAGNOSIS — R109 Unspecified abdominal pain: Secondary | ICD-10-CM | POA: Diagnosis not present

## 2023-05-04 DIAGNOSIS — Z91040 Latex allergy status: Secondary | ICD-10-CM | POA: Insufficient documentation

## 2023-05-04 DIAGNOSIS — Z8673 Personal history of transient ischemic attack (TIA), and cerebral infarction without residual deficits: Secondary | ICD-10-CM | POA: Diagnosis not present

## 2023-05-04 DIAGNOSIS — Z982 Presence of cerebrospinal fluid drainage device: Secondary | ICD-10-CM | POA: Insufficient documentation

## 2023-05-04 DIAGNOSIS — R782 Finding of cocaine in blood: Secondary | ICD-10-CM | POA: Insufficient documentation

## 2023-05-04 DIAGNOSIS — I2510 Atherosclerotic heart disease of native coronary artery without angina pectoris: Secondary | ICD-10-CM | POA: Insufficient documentation

## 2023-05-04 DIAGNOSIS — R4182 Altered mental status, unspecified: Secondary | ICD-10-CM | POA: Diagnosis present

## 2023-05-04 LAB — URINALYSIS, W/ REFLEX TO CULTURE (INFECTION SUSPECTED)
Bacteria, UA: NONE SEEN
Bilirubin Urine: NEGATIVE
Glucose, UA: NEGATIVE mg/dL
Hgb urine dipstick: NEGATIVE
Ketones, ur: NEGATIVE mg/dL
Leukocytes,Ua: NEGATIVE
Nitrite: NEGATIVE
Protein, ur: NEGATIVE mg/dL
Specific Gravity, Urine: 1.004 — ABNORMAL LOW (ref 1.005–1.030)
pH: 6 (ref 5.0–8.0)

## 2023-05-04 LAB — CBC WITH DIFFERENTIAL/PLATELET
Abs Immature Granulocytes: 0.02 10*3/uL (ref 0.00–0.07)
Basophils Absolute: 0 10*3/uL (ref 0.0–0.1)
Basophils Relative: 1 %
Eosinophils Absolute: 0.1 10*3/uL (ref 0.0–0.5)
Eosinophils Relative: 1 %
HCT: 34.3 % — ABNORMAL LOW (ref 36.0–46.0)
Hemoglobin: 11.8 g/dL — ABNORMAL LOW (ref 12.0–15.0)
Immature Granulocytes: 0 %
Lymphocytes Relative: 46 %
Lymphs Abs: 3.2 10*3/uL (ref 0.7–4.0)
MCH: 30.5 pg (ref 26.0–34.0)
MCHC: 34.4 g/dL (ref 30.0–36.0)
MCV: 88.6 fL (ref 80.0–100.0)
Monocytes Absolute: 0.5 10*3/uL (ref 0.1–1.0)
Monocytes Relative: 7 %
Neutro Abs: 3.1 10*3/uL (ref 1.7–7.7)
Neutrophils Relative %: 45 %
Platelets: 310 10*3/uL (ref 150–400)
RBC: 3.87 MIL/uL (ref 3.87–5.11)
RDW: 13.3 % (ref 11.5–15.5)
WBC: 6.9 10*3/uL (ref 4.0–10.5)
nRBC: 0 % (ref 0.0–0.2)

## 2023-05-04 LAB — COMPREHENSIVE METABOLIC PANEL
ALT: 9 U/L (ref 0–44)
AST: 10 U/L — ABNORMAL LOW (ref 15–41)
Albumin: 3.2 g/dL — ABNORMAL LOW (ref 3.5–5.0)
Alkaline Phosphatase: 79 U/L (ref 38–126)
Anion gap: 11 (ref 5–15)
BUN: 10 mg/dL (ref 6–20)
CO2: 20 mmol/L — ABNORMAL LOW (ref 22–32)
Calcium: 8.9 mg/dL (ref 8.9–10.3)
Chloride: 99 mmol/L (ref 98–111)
Creatinine, Ser: 1.02 mg/dL — ABNORMAL HIGH (ref 0.44–1.00)
GFR, Estimated: >60 mL/min (ref >60)
Glucose, Bld: 77 mg/dL (ref 70–99)
Potassium: 3.8 mmol/L (ref 3.5–5.1)
Sodium: 130 mmol/L — ABNORMAL LOW (ref 135–145)
Total Bilirubin: 0.6 mg/dL (ref 0.3–1.2)
Total Protein: 5.7 g/dL — ABNORMAL LOW (ref 6.5–8.1)

## 2023-05-04 LAB — RAPID URINE DRUG SCREEN, HOSP PERFORMED
Amphetamines: NOT DETECTED
Barbiturates: NOT DETECTED
Benzodiazepines: POSITIVE — AB
Cocaine: POSITIVE — AB
Opiates: NOT DETECTED
Tetrahydrocannabinol: POSITIVE — AB

## 2023-05-04 LAB — TROPONIN I (HIGH SENSITIVITY)
Troponin I (High Sensitivity): <2 ng/L (ref <18)
Troponin I (High Sensitivity): <2 ng/L (ref <18)

## 2023-05-04 LAB — ETHANOL: Alcohol, Ethyl (B): <10 mg/dL (ref <10)

## 2023-05-04 LAB — LIPASE, BLOOD: Lipase: 30 U/L (ref 11–51)

## 2023-05-04 LAB — ACETAMINOPHEN LEVEL: Acetaminophen (Tylenol), Serum: <10 ug/mL — ABNORMAL LOW (ref 10–30)

## 2023-05-04 LAB — SALICYLATE LEVEL: Salicylate Lvl: <7 mg/dL — ABNORMAL LOW (ref 7.0–30.0)

## 2023-05-04 MED ORDER — ACETAMINOPHEN 325 MG PO TABS
650.0000 mg | ORAL_TABLET | Freq: Once | ORAL | Status: AC
  Administered 2023-05-04: 650 mg via ORAL
  Filled 2023-05-04: qty 2

## 2023-05-04 MED ORDER — DIAZEPAM 5 MG/ML IJ SOLN
5.0000 mg | Freq: Once | INTRAMUSCULAR | Status: AC
  Administered 2023-05-04: 5 mg via INTRAVENOUS

## 2023-05-04 MED ORDER — LACTATED RINGERS IV BOLUS
1000.0000 mL | Freq: Once | INTRAVENOUS | Status: AC
  Administered 2023-05-04: 1000 mL via INTRAVENOUS

## 2023-05-04 MED ORDER — DIAZEPAM 5 MG/ML IJ SOLN
5.0000 mg | Freq: Once | INTRAMUSCULAR | Status: AC
  Administered 2023-05-04: 5 mg via INTRAVENOUS
  Filled 2023-05-04: qty 2

## 2023-05-04 NOTE — ED Provider Notes (Signed)
[place] EMERGENCY DEPARTMENT AT [facility] Provider Note   CSN: [id number] Arrival date & time: 05/04/23  1719     History  Chief Complaint  Patient presents with   Altered Mental Status    Kelli Miranda is a 47 y.o. female.  HPI 47 year old female presents with altered mental status.  Symptoms started around 4 PM.  About 30 minutes prior to that she used marijuana.  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  He thinks he last used about 3 days ago.  He does not think she is today.  After the marijuana she started acting agitated and was talking to dead relatives.  She has been altered ever since.  She has been having some right-sided abdominal pain where her VP shunt goes and he is worried there might be a problem with the shunt.  A few days ago after doing some unknown drugs she also had an episode where she was going unresponsive and had to be given a medicine to reverse that (Narcan?).  This happened twice, and they took her home and she was okay ever since.  History is unavailable from the patient due to altered mental state.  Home Medications Prior to Admission medications   Medication Sig Start Date End Date Taking? Authorizing Provider  ALPRAZolam Prudy Feeler) 0.5 MG tablet Take 0.5 mg by mouth 3 (three) times daily as needed for anxiety.     [provider]  Ascorbic Acid (VITAMIN C) 1000 MG tablet Take 1,000 mg by mouth daily.    [provider]  fenofibrate 160 MG tablet Take 160 mg by mouth daily. 06/29/18   [provider]  Fluticasone-Salmeterol (ADVAIR) 250-50 MCG/DOSE AEPB Inhale 1 puff into the lungs 2 (two) times daily.     [provider]  HYDROcodone-acetaminophen (NORCO/VICODIN) 5-325 MG tablet Take 1 tablet by mouth 2 (two) times daily as needed for moderate pain.     [provider]  levETIRAcetam (KEPPRA) 500 MG tablet Take 500 mg by mouth 2 (two) times daily.      [provider]  potassium chloride (K-DUR,KLOR-CON) 10 MEQ tablet Take 10 mEq by mouth 2 (two) times daily.    [provider]  pregabalin (LYRICA) 100 MG capsule Take 200 mg by mouth 3 (three) times daily.     [provider]  promethazine (PHENERGAN) 25 MG tablet TAKE 1 TABLET BY MOUTH EVERY 6 HOURS AS NEEDED FOR NAUSEA Patient taking differently: TAKE 1 TABLET BY MOUTH EVERY 8 HOURS AS NEEDED FOR NAUSEA 11/11/16   Dettinger, Elige Radon, MD  QUEtiapine (SEROQUEL) 100 MG tablet Take 300 mg by mouth at bedtime as needed (sleep).     [provider]  topiramate (TOPAMAX) 200 MG tablet Take 200 mg by mouth 2 (two) times daily.     [provider]  traZODone (DESYREL) 100 MG tablet Take 300 mg by mouth at bedtime as needed for sleep.     [provider]  VENTOLIN HFA 108 (90 Base) MCG/ACT inhaler INHALE 2 PUFFS BY MOUTH EVERY 4 TO 6 HOURS AS NEEDED FOR WHEEZING. 10/11/16   [name], MD  vitamin B-12 500 MCG tablet Take 1 tablet (500 mcg total) by mouth daily. 10/27/15   [name], MD      Allergies    Oxycodone-acetaminophen, Sumatriptan, Iodinated contrast media, Latex, Nicotine, Oxycodone, and Other    Review of Systems   Review of Systems  Unable to perform ROS: Mental status change    Physical Exam Updated Vital Signs BP 113/71 (BP Location: Right Arm)   Pulse 78   Temp 98.7 F (37.1 C) (Oral)   Resp 18   Ht 5\' 2"  (1.575 m)   Wt 54.4 kg   SpO2 98%   BMI 21.95 kg/m  Physical Exam Vitals and nursing note reviewed.  Constitutional:      Appearance: She is well-developed.  HENT:     Head: Normocephalic and atraumatic.  Eyes:     Pupils: Pupils are equal, round, and reactive to light.  Cardiovascular:     Rate and Rhythm: Normal rate and regular rhythm.     Heart sounds: Normal heart sounds.  Pulmonary:     Effort: Pulmonary effort is normal.     Breath sounds: Normal breath sounds.  Abdominal:     Palpations:  Abdomen is soft.     Tenderness: There is abdominal tenderness (right sided. no clear mass appreciated).  Musculoskeletal:     Cervical back: No rigidity.  Skin:    General: Skin is warm and dry.  Neurological:     Mental Status: She is alert.     Comments: Patient is agitated. Doesn't follow commands. Is moving all 4 extremities.      ED Results / Procedures / Treatments   Labs (all labs ordered are listed, but only abnormal results are displayed) Labs Reviewed  COMPREHENSIVE METABOLIC PANEL - Abnormal; Notable for the following components:      Result Value   Sodium 130 (*)    CO2 20 (*)    Creatinine, Ser 1.02 (*)    Total Protein 5.7 (*)    Albumin 3.2 (*)    AST 10 (*)    All other components within normal limits  ACETAMINOPHEN LEVEL - Abnormal; Notable for the following components:   Acetaminophen (Tylenol), Serum <10 (*)    All other components within normal limits  SALICYLATE LEVEL - Abnormal; Notable for the following components:   Salicylate Lvl <7.0 (*)    All other components within normal limits  URINALYSIS, W/ REFLEX TO CULTURE (INFECTION SUSPECTED) - Abnormal; Notable for the following components:   Specific Gravity, Urine 1.004 (*)    All other components within normal limits  RAPID URINE DRUG SCREEN, HOSP PERFORMED - Abnormal; Notable for the following components:   Cocaine POSITIVE (*)    Benzodiazepines POSITIVE (*)    Tetrahydrocannabinol POSITIVE (*)    All other components within normal limits  CBC WITH DIFFERENTIAL/PLATELET - Abnormal; Notable for the following components:   Hemoglobin 11.8 (*)    HCT 34.3 (*)    All other components within normal limits  ETHANOL  LIPASE, BLOOD  TROPONIN I (HIGH SENSITIVITY)  TROPONIN I (HIGH SENSITIVITY)    EKG EKG Interpretation  Date/Time:  Sunday May 04 2023 17:29:17 EDT Ventricular Rate:  80 PR Interval:  165 QRS Duration: 101 QT Interval:  423 QTC Calculation: 488 R Axis:   53 Text  Interpretation: Sinus rhythm RSR' in V1 or V2, probably normal variant Borderline prolonged QT interval No old tracing to compare Confirmed by Pricilla Loveless [phone]) on 05/04/2023 7:20:08 PM  Radiology CT CHEST ABDOMEN PELVIS WO CONTRAST  Result Date: 05/04/2023 CLINICAL DATA:  Sepsis EXAM: CT CHEST, ABDOMEN AND PELVIS WITHOUT CONTRAST TECHNIQUE: Multidetector CT imaging of the chest, abdomen and pelvis was performed following the standard protocol without IV contrast. RADIATION DOSE REDUCTION: This exam was performed according to the  departmental dose-optimization program which includes automated exposure control, adjustment of the mA and/or kV according to patient size and/or use of iterative reconstruction technique. COMPARISON:  07/11/2018 FINDINGS: CT CHEST FINDINGS Cardiovascular: Heart is normal size. Aorta is normal caliber. Scattered coronary artery calcifications. Mediastinum/Nodes: No mediastinal, hilar, or axillary adenopathy. Trachea and esophagus are unremarkable. Thyroid unremarkable. Lungs/Pleura: Lungs are clear. No focal airspace opacities or suspicious nodules. No effusions. Musculoskeletal: Chest wall soft tissues are unremarkable. No acute bony abnormality. CT ABDOMEN PELVIS FINDINGS Hepatobiliary: No focal liver abnormality is seen. Status post cholecystectomy. No biliary dilatation. Pancreas: No focal abnormality or ductal dilatation. Spleen: No focal abnormality.  Normal size. Adrenals/Urinary Tract: No adrenal abnormality. No focal renal abnormality. No stones or hydronephrosis. Urinary bladder is unremarkable. Stomach/Bowel: Stomach, large and small bowel grossly unremarkable. Vascular/Lymphatic: Aortic atherosclerosis. No evidence of aneurysm or adenopathy. Reproductive: Prior hysterectomy.  No adnexal masses. Other: Small amount of free fluid in the cul-de-sac. No free air. Right VP shunt catheter noted, terminating near the midline of the lower abdomen. Catheter appears intact along  its course in the neck, chest and abdomen. Musculoskeletal: No acute bony abnormality. IMPRESSION: No acute findings in the chest, abdomen or pelvis. Coronary artery disease, aortic atherosclerosis. VP shunt catheter in the right chest wall and entering the abdomen in the right upper quadrant. Catheter terminates in the midline and appears intact. Electronically Signed   By: Charlett Nose M.D.   On: 05/04/2023 19:26   CT Head Wo Contrast  Result Date: 05/04/2023 CLINICAL DATA:  Mental status change, unknown cause EXAM: CT HEAD WITHOUT CONTRAST TECHNIQUE: Contiguous axial images were obtained from the base of the skull through the vertex without intravenous contrast. RADIATION DOSE REDUCTION: This exam was performed according to the departmental dose-optimization program which includes automated exposure control, adjustment of the mA and/or kV according to patient size and/or use of iterative reconstruction technique. COMPARISON:  11/13/2017 FINDINGS: Brain: Right parietal VP shunt in place with the tip near the midline of the lateral ventricles. Ventricular size has increased since prior study but appears relatively normal. Lacunar infarct scar saddle lacunar infarct in the right thalamus, unchanged. No hemorrhage or acute infarction. Vascular: No hyperdense vessel or unexpected calcification. Skull: No acute calvarial abnormality. Sinuses/Orbits: No acute findings Other: None IMPRESSION: Right parietal VP shunt in place. Increasing size of the lateral ventricles since prior study, but ventricular size appears normal. Old right thalamic lacunar infarct, stable. No acute intracranial abnormality. Electronically Signed   By: Charlett Nose M.D.   On: 05/04/2023 19:23   DG Chest Portable 1 View  Result Date: 05/04/2023 CLINICAL DATA:  AMS EXAM: PORTABLE CHEST 1 VIEW COMPARISON:  June 07, 2021 FINDINGS: The cardiomediastinal silhouette is unchanged in contour. No pleural effusion. No pneumothorax. No acute  pleuroparenchymal abnormality. IMPRESSION: No acute cardiopulmonary abnormality. Electronically Signed   By: Meda Klinefelter M.D.   On: 05/04/2023 19:11    Procedures .Critical Care  Performed by: Pricilla Loveless, MD Authorized by: Pricilla Loveless, MD   Critical care provider statement:    Critical care time (minutes):  35   Critical care time was exclusive of:  Separately billable procedures and treating other patients   Critical care was necessary to treat or prevent imminent or life-threatening deterioration of the following conditions:  CNS failure or compromise   Critical care was time spent personally by me on the following activities:  Development of treatment plan with patient or surrogate, discussions with consultants, evaluation of patient's response  to treatment, examination of patient, ordering and review of laboratory studies, ordering and review of radiographic studies, ordering and performing treatments and interventions, pulse oximetry, re-evaluation of patient's condition and review of old charts     Medications Ordered in ED Medications  diazepam (VALIUM) injection 5 mg (5 mg Intravenous Given 05/04/23 1759)  lactated ringers bolus 1,000 mL (0 mLs Intravenous Stopped 05/04/23 2235)  diazepam (VALIUM) injection 5 mg (5 mg Intravenous Given 05/04/23 1850)  acetaminophen (TYLENOL) tablet 650 mg (650 mg Oral Given 05/04/23 1938)    ED Course/ Medical Decision Making/ A&P                             Medical Decision Making Amount and/or Complexity of Data Reviewed Labs: ordered.    Details: Normal WBC, mildly low sodium.  Positive THC, benzodiazepines and cocaine.  Normal EtOH. Radiology: ordered and independent interpretation performed.    Details: No significant ventriculomegaly.  No bowel obstruction or obvious break in the VP shunt. ECG/medicine tests: ordered and independent interpretation performed.    Details: No acute ischemia.  Risk OTC drugs. Prescription drug  management.   On arrival, patient is agitated and altered.  I suspect this is from polysubstance abuse.  She was given multiple doses of IV Valium and ultimately improved and now is back to normal after observation period.  She is afebrile, and vitals are normal.  She does have some right upper quadrant discomfort, unclear if this is related to her VP shunt or not.  There is no break on the workup here today.  Radiology notes that her ventricles are mildly enlarged compared to her baseline but they are still not consistent with ventriculomegaly.  Given she has returned back to normal per husband, I doubt this is a VP shunt problem.  UDS is positive for cocaine which I suspect is a part of the problem today.  Given she is returned to baseline I think she is stable for discharge home with return precautions.        Final Clinical Impression(s) / ED Diagnoses Final diagnoses:  Polysubstance abuse [facility])    Rx / DC Orders ED Discharge Orders     None         Pricilla Loveless, MD 05/04/23 2333

## 2023-05-04 NOTE — Discharge Instructions (Addendum)
If she develops new or worsening mental status changes, vomiting, fever, or any other new/concerning symptoms and return to the ER or call 911.

## 2023-05-04 NOTE — ED Triage Notes (Signed)
Pt coming in for altered mental status, became combative, restless, and was thrashing at home. Pt very altered, occasionally settles and can answer some basic questions. Per husband, pt uses meth, last usage was 3 days ago. Pt admits to smoking a little weed this evening. Pt is very restless and active in the bed. Family concerned that VP shunt may be infected. Pt having a lot of pain at termination site. Pt had similar episode like this 3 days ago after using meth.

## 2023-06-09 ENCOUNTER — Ambulatory Visit: Payer: 59 | Attending: Neurosurgery | Admitting: Physical Therapy

## 2023-06-09 ENCOUNTER — Other Ambulatory Visit: Payer: Self-pay

## 2023-06-09 ENCOUNTER — Encounter: Payer: Self-pay | Admitting: Physical Therapy

## 2023-06-09 DIAGNOSIS — M62830 Muscle spasm of back: Secondary | ICD-10-CM | POA: Diagnosis present

## 2023-06-09 DIAGNOSIS — M5459 Other low back pain: Secondary | ICD-10-CM | POA: Insufficient documentation

## 2023-06-09 NOTE — Therapy (Signed)
OUTPATIENT PHYSICAL THERAPY THORACOLUMBAR EVALUATION   Patient Name: SADEEL FIDDLER MRN: [id number] DOB:1976/05/03, 47 y.o., female Today's Date: 06/09/2023  END OF SESSION:  PT End of Session - 06/09/23 1414     Visit Number 1    Number of Visits 6    Date for PT Re-Evaluation 07/07/23    PT Start Time 0146    PT Stop Time 0213    PT Time Calculation (min) 27 min    Activity Tolerance Patient tolerated treatment well    Behavior During Therapy WFL for tasks assessed/performed             Past Medical History:  Diagnosis Date   Arthritis    Bipolar disorder (HCC)    Borderline diabetic    COPD (chronic obstructive pulmonary disease) (HCC)    Headache    Hemiparesis (HCC)    Left   Hypertension    Migraines    Nerve damage    Left-side of the body due to stroke   Obstructive hydrocephalus (HCC)    Pneumonia    recently treated for by C. Charm Barges, PCP, for pneumonia, also had cxr at [facility] in [place].  Finished Prednisone & antibiotic.12/31/2017   Seizures (HCC)    Stroke [facility])    Past Surgical History:  Procedure Laterality Date   ABDOMINAL HYSTERECTOMY     BRAIN SURGERY     CHOLECYSTECTOMY     KNEE SURGERY     LAPAROSCOPIC APPENDECTOMY N/A 07/11/2018   Procedure: APPENDECTOMY LAPAROSCOPIC;  Surgeon: Gaynelle Adu, MD;  Location: [facility] OR;  Service: General;  Laterality: N/A;   SHUNT REVISION VENTRICULAR-PERITONEAL N/A 01/02/2018   Procedure: SHUNT REVISION  with programmable valve;  Surgeon: [name], MD;  Location: [facility] OR;  Service: Neurosurgery;  Laterality: N/A;   VENTRICULOPERITONEAL SHUNT     Patient Active Problem List   Diagnosis Date Noted   Appendicitis 07/11/2018   Malfunction of ventriculo-peritoneal shunt (HCC) 01/02/2018   Hemiparesis affecting left side as late effect of cerebrovascular accident (CVA) (HCC) 10/02/2016   Tobacco use disorder 01/31/2016   Hyperglycemia 10/26/2015   Hypokalemia 10/26/2015    REFERRING PROVIDER:  Lisbeth Renshaw MD  REFERRING DIAG: Lumbar spondylosis  Rationale for Evaluation and Treatment: Rehabilitation  THERAPY DIAG:  Other low back pain  Muscle spasm of back  ONSET DATE: Years.  SUBJECTIVE:                                                                                                                                                                                           SUBJECTIVE STATEMENT: The patient presents to the clinic  with c/o chronic back pain.  She rates her pain at 8/10 today and can become severe when trying to clean her house.  Heat helps some to decrease her pain.  Her pain significantly decreases her ability to perform ADL's.  Her pain tends to be worse on the right.    PERTINENT HISTORY:  Please see above for PMH.  PAIN:  Are you having pain? Yes: NPRS scale: 8/10 Pain location: Lower thoracic and lumbar, right > left. Pain description: Throbbing. Aggravating factors: "Everything." Relieving factors: Heat.  PRECAUTIONS: Other: ventriculoperitoneal shunt for hydrocephalus (revised in 2019).  WEIGHT BEARING RESTRICTIONS: No  FALLS:  Has patient fallen in last 6 months? No  LIVING ENVIRONMENT: Lives with: lives with their spouse Lives in: House/apartment Has following equipment at home: None  PLOF: Independent with basic ADLs  PATIENT GOALS: Decrease pain.   OBJECTIVE:   DIAGNOSTIC FINDINGS:  Recent CT scan of head and abdomen were unremarkable.  POSTURE:  Lumbar scoliosis noted with convexity on right.  PALPATION: Patient c/o diffuse right lower thoracic and lumbar musculature pain with increased tone.  LUMBAR ROM:   Full active lumbar flexion and extension.  LOWER EXTREMITY ROM:     WNL.  LOWER EXTREMITY MMT:    Normal LE strength.  LUMBAR SPECIAL TESTS:  Patient did c/o some pain increase with bilateral SLR testing.  Diminished bilateral LE DTR's.   (-) Romberg.  GAIT:   WNL.   ASSESSMENT:  CLINICAL  IMPRESSION: The patient presents to OPPT with c/o chronic spinal pain.  Today, she c/o pain over her right lower thoracic and lumbar musculature which did exhibit increased tone.  Lumbar scoliosis observed with convexity on right.  Her active lumbar flexion and extension is WNL.  Her LE strength is normal.  She demonstrates diminished LE DTR's.  She had some pain reproduction with bilateral SLR testing.  ADL performance produces severe pain.  Patient may benefit from skilled physical therapy intervention to address pain and deficits.  OBJECTIVE IMPAIRMENTS: decreased activity tolerance, increased muscle spasms, postural dysfunction, and pain.   ACTIVITY LIMITATIONS: carrying, lifting, and bending  PARTICIPATION LIMITATIONS: meal prep, cleaning, and laundry  PERSONAL FACTORS: Time since onset of injury/illness/exacerbation are also affecting patient's functional outcome.   REHAB POTENTIAL: Fair    CLINICAL DECISION MAKING: Evolving/moderate complexity  EVALUATION COMPLEXITY: Low   GOALS:  SHORT TERM GOALS: Target date: 07/21/23.  Ind with a HEP. Goal status: INITIAL  2.  Perform ADL's with pain not > 5/10.  Goal status: INITIAL  PLAN:  PT FREQUENCY: 1-2x/week  PT DURATION: other: 3-4 weeks.  PLANNED INTERVENTIONS: Therapeutic exercises, Therapeutic activity, Patient/Family education, Self Care, Electrical stimulation, Cryotherapy, Moist heat, Ultrasound, and Manual therapy.  PLAN FOR NEXT SESSION: Core exercise progression, body mechanics and spinal protection techniques.  Modalities and STW/M as needed.   Nohelia Valenza, [place], PT 06/09/2023, 3:47 PM

## 2023-06-12 ENCOUNTER — Ambulatory Visit: Payer: 59 | Admitting: *Deleted

## 2023-06-12 DIAGNOSIS — M5459 Other low back pain: Secondary | ICD-10-CM

## 2023-06-12 DIAGNOSIS — M62830 Muscle spasm of back: Secondary | ICD-10-CM

## 2023-06-12 NOTE — Therapy (Signed)
OUTPATIENT PHYSICAL THERAPY THORACOLUMBAR TREATMENT   Patient Name: Kelli Miranda MRN: [id number] DOB:09-Dec-1976, 47 y.o., female Today's Date: 06/12/2023  END OF SESSION:  PT End of Session - 06/12/23 1358     Visit Number 2    Number of Visits 6             Past Medical History:  Diagnosis Date   Arthritis    Bipolar disorder (HCC)    Borderline diabetic    COPD (chronic obstructive pulmonary disease) (HCC)    Headache    Hemiparesis (HCC)    Left   Hypertension    Migraines    Nerve damage    Left-side of the body due to stroke   Obstructive hydrocephalus (HCC)    Pneumonia    recently treated for by C. Charm Barges, PCP, for pneumonia, also had cxr at [facility] in [place].  Finished Prednisone & antibiotic.12/31/2017   Seizures (HCC)    Stroke [facility])    Past Surgical History:  Procedure Laterality Date   ABDOMINAL HYSTERECTOMY     BRAIN SURGERY     CHOLECYSTECTOMY     KNEE SURGERY     LAPAROSCOPIC APPENDECTOMY N/A 07/11/2018   Procedure: APPENDECTOMY LAPAROSCOPIC;  Surgeon: Gaynelle Adu, MD;  Location: [facility] OR;  Service: General;  Laterality: N/A;   SHUNT REVISION VENTRICULAR-PERITONEAL N/A 01/02/2018   Procedure: SHUNT REVISION  with programmable valve;  Surgeon: [name], MD;  Location: [facility] OR;  Service: Neurosurgery;  Laterality: N/A;   VENTRICULOPERITONEAL SHUNT     Patient Active Problem List   Diagnosis Date Noted   Appendicitis 07/11/2018   Malfunction of ventriculo-peritoneal shunt (HCC) 01/02/2018   Hemiparesis affecting left side as late effect of cerebrovascular accident (CVA) (HCC) 10/02/2016   Tobacco use disorder 01/31/2016   Hyperglycemia 10/26/2015   Hypokalemia 10/26/2015    REFERRING PROVIDER: Lisbeth Renshaw MD  REFERRING DIAG: Lumbar spondylosis  Rationale for Evaluation and Treatment: Rehabilitation  THERAPY DIAG:  Other low back pain  Muscle spasm of back  ONSET DATE: Years.  SUBJECTIVE:                                                                                                                                                                                            SUBJECTIVE STATEMENT: Did okay after eval, but really hurting today     PERTINENT HISTORY:  Please see above for PMH.  PAIN:  Are you having pain? Yes: NPRS scale: 8/10 Pain location: Lower thoracic and lumbar, right > left. Pain description: Throbbing. Aggravating factors: "Everything." Relieving factors: Heat.  PRECAUTIONS: Other: ventriculoperitoneal shunt for hydrocephalus (  revised in 2019).  WEIGHT BEARING RESTRICTIONS: No  FALLS:  Has patient fallen in last 6 months? No  LIVING ENVIRONMENT: Lives with: lives with their spouse Lives in: House/apartment Has following equipment at home: None  PLOF: Independent with basic ADLs  PATIENT GOALS: Decrease pain.   OBJECTIVE:                                     EXERCISE LOG  Exercise Repetitions and Resistance Comments  Nustep L1 x 8 mins                    Blank cell = exercise not performed today  [place] combo 1.5 w/cm2 x 12 mins to Bil LB paras in RT side lying Manual STW to Bil Thoracolumbar paras in RT side lying  DIAGNOSTIC FINDINGS:  Recent CT scan of head and abdomen were unremarkable.  POSTURE:  Lumbar scoliosis noted with convexity on right.  PALPATION: Patient c/o diffuse right lower thoracic and lumbar musculature pain with increased tone.  LUMBAR ROM:   Full active lumbar flexion and extension.  LOWER EXTREMITY ROM:     WNL.  LOWER EXTREMITY MMT:    Normal LE strength.  LUMBAR SPECIAL TESTS:  Patient did c/o some pain increase with bilateral SLR testing.  Diminished bilateral LE DTR's.   (-) Romberg.  GAIT:   WNL.   ASSESSMENT:  CLINICAL IMPRESSION: Pt arrived today doing fair , but with 8/10 LBP. Pt was able to perform about 8 minutes of therex, but needed to stop due to pain. [place] combo performed as well as STW  to Bil  thoracolumbar paras with Pt in RT side lying with notable tenderness RT side.     OBJECTIVE IMPAIRMENTS: decreased activity tolerance, increased muscle spasms, postural dysfunction, and pain.   ACTIVITY LIMITATIONS: carrying, lifting, and bending  PARTICIPATION LIMITATIONS: meal prep, cleaning, and laundry  PERSONAL FACTORS: Time since onset of injury/illness/exacerbation are also affecting patient's functional outcome.   REHAB POTENTIAL: Fair    CLINICAL DECISION MAKING: Evolving/moderate complexity  EVALUATION COMPLEXITY: Low   GOALS:  SHORT TERM GOALS: Target date: 07/21/23.  Ind with a HEP. Goal status: INITIAL  2.  Perform ADL's with pain not > 5/10.  Goal status: INITIAL  PLAN:  PT FREQUENCY: 1-2x/week  PT DURATION: other: 3-4 weeks.  PLANNED INTERVENTIONS: Therapeutic exercises, Therapeutic activity, Patient/Family education, Self Care, Electrical stimulation, Cryotherapy, Moist heat, Ultrasound, and Manual therapy.  PLAN FOR NEXT SESSION: Core exercise progression, body mechanics and spinal protection techniques.  Modalities and STW/M as needed.   Marivel Mcclarty,CHRIS, PTA 06/12/2023, 2:59 PM

## 2023-06-17 ENCOUNTER — Ambulatory Visit: Payer: 59

## 2023-06-17 DIAGNOSIS — M5459 Other low back pain: Secondary | ICD-10-CM

## 2023-06-17 DIAGNOSIS — M62830 Muscle spasm of back: Secondary | ICD-10-CM

## 2023-06-17 NOTE — Therapy (Signed)
OUTPATIENT PHYSICAL THERAPY THORACOLUMBAR TREATMENT   Patient Name: Kelli Miranda MRN: [id number] DOB:12-17-1976, 47 y.o., female Today's Date: 06/17/2023  END OF SESSION:  PT End of Session - 06/17/23 1326     Visit Number 3    Number of Visits 6    Date for PT Re-Evaluation 07/07/23    PT Start Time 1300    PT Stop Time 1345    PT Time Calculation (min) 45 min    Activity Tolerance Patient tolerated treatment well    Behavior During Therapy WFL for tasks assessed/performed             Past Medical History:  Diagnosis Date   Arthritis    Bipolar disorder (HCC)    Borderline diabetic    COPD (chronic obstructive pulmonary disease) (HCC)    Headache    Hemiparesis (HCC)    Left   Hypertension    Migraines    Nerve damage    Left-side of the body due to stroke   Obstructive hydrocephalus (HCC)    Pneumonia    recently treated for by C. Charm Barges, PCP, for pneumonia, also had cxr at [facility] in [place].  Finished Prednisone & antibiotic.12/31/2017   Seizures (HCC)    Stroke [facility])    Past Surgical History:  Procedure Laterality Date   ABDOMINAL HYSTERECTOMY     BRAIN SURGERY     CHOLECYSTECTOMY     KNEE SURGERY     LAPAROSCOPIC APPENDECTOMY N/A 07/11/2018   Procedure: APPENDECTOMY LAPAROSCOPIC;  Surgeon: Gaynelle Adu, MD;  Location: [facility] OR;  Service: General;  Laterality: N/A;   SHUNT REVISION VENTRICULAR-PERITONEAL N/A 01/02/2018   Procedure: SHUNT REVISION  with programmable valve;  Surgeon: [name], MD;  Location: [facility] OR;  Service: Neurosurgery;  Laterality: N/A;   VENTRICULOPERITONEAL SHUNT     Patient Active Problem List   Diagnosis Date Noted   Appendicitis 07/11/2018   Malfunction of ventriculo-peritoneal shunt (HCC) 01/02/2018   Hemiparesis affecting left side as late effect of cerebrovascular accident (CVA) (HCC) 10/02/2016   Tobacco use disorder 01/31/2016   Hyperglycemia 10/26/2015   Hypokalemia 10/26/2015    REFERRING PROVIDER:  Lisbeth Renshaw MD  REFERRING DIAG: Lumbar spondylosis  Rationale for Evaluation and Treatment: Rehabilitation  THERAPY DIAG:  Other low back pain  Muscle spasm of back  ONSET DATE: Years.  SUBJECTIVE:                                                                                                                                                                                           SUBJECTIVE STATEMENT: Pt reports 10/10 right stomach pain  and low back pain today.  Reports she called her doc yesterday about stomach pain, but has not heard back.   PERTINENT HISTORY:  Please see above for PMH.  PAIN:  Are you having pain? Yes: NPRS scale: 10/10 Pain location: Lower thoracic and lumbar, right > left. Pain description: Throbbing. Aggravating factors: "Everything." Relieving factors: Heat.  PRECAUTIONS: Other: ventriculoperitoneal shunt for hydrocephalus (revised in 2019).  WEIGHT BEARING RESTRICTIONS: No  FALLS:  Has patient fallen in last 6 months? No  LIVING ENVIRONMENT: Lives with: lives with their spouse Lives in: House/apartment Has following equipment at home: None  PLOF: Independent with basic ADLs  PATIENT GOALS: Decrease pain.   OBJECTIVE:   TODAY'S TREATMENT:  Manual Therapy Soft Tissue Mobilization: Lumbar, STW/M to bil lumbar paraspinals to decrease pain and tone with pt in right side-lying for comfort with towel under right side to ease stomach pain.    Modalities  Date:  Unattended Estim: Lumbar, IFC 80-150 Hz, 15 mins, Pain Combo: Lumbar, 1.5 w/cm2 bil lumbar paraspinals, 12 mins, Pain Hot Pack: Lumbar, 15 mins, Pain and Tone                                      DIAGNOSTIC FINDINGS:  Recent CT scan of head and abdomen were unremarkable.  POSTURE:  Lumbar scoliosis noted with convexity on right.  PALPATION: Patient c/o diffuse right lower thoracic and lumbar musculature pain with increased tone.  LUMBAR ROM:   Full active lumbar  flexion and extension.  LOWER EXTREMITY ROM:     WNL.  LOWER EXTREMITY MMT:    Normal LE strength.  LUMBAR SPECIAL TESTS:  Patient did c/o some pain increase with bilateral SLR testing.  Diminished bilateral LE DTR's.   (-) Romberg.  GAIT:   WNL.   ASSESSMENT:  CLINICAL IMPRESSION: Pt arrives for today's treatment session reporting 10/10 right stomach pain and low back pain.  Pt declined performance of Nustep today due to increased pain level.  Pt reports that she called her MD yesterday and left a voicemail, but has not heard bad from him.  Normal responses to all modalities noted up removal.  STW/M performed to bil lumbar paraspinals to decrease pain and tone.  Pt positioned in right side-lying for all modalities and manual therapy for comfort.  Pt reported decrease in low back pain at completion of today's treatment session.  OBJECTIVE IMPAIRMENTS: decreased activity tolerance, increased muscle spasms, postural dysfunction, and pain.   ACTIVITY LIMITATIONS: carrying, lifting, and bending  PARTICIPATION LIMITATIONS: meal prep, cleaning, and laundry  PERSONAL FACTORS: Time since onset of injury/illness/exacerbation are also affecting patient's functional outcome.   REHAB POTENTIAL: Fair    CLINICAL DECISION MAKING: Evolving/moderate complexity  EVALUATION COMPLEXITY: Low   GOALS:  SHORT TERM GOALS: Target date: 07/21/23.  Ind with a HEP. Goal status: INITIAL  2.  Perform ADL's with pain not > 5/10.  Goal status: INITIAL  PLAN:  PT FREQUENCY: 1-2x/week  PT DURATION: other: 3-4 weeks.  PLANNED INTERVENTIONS: Therapeutic exercises, Therapeutic activity, Patient/Family education, Self Care, Electrical stimulation, Cryotherapy, Moist heat, Ultrasound, and Manual therapy.  PLAN FOR NEXT SESSION: Core exercise progression, body mechanics and spinal protection techniques.  Modalities and STW/M as needed.   Newman Pies, PTA 06/17/2023, 2:22 PM

## 2023-06-19 ENCOUNTER — Ambulatory Visit: Payer: 59

## 2023-06-19 NOTE — Therapy (Signed)
Pt arrives for today's treatment session reporting 10/10 left abdominal pain.  Pt reports that she was told by her MD that she has a "knot" in her shunt.  Pt states that the "knot" has gotten bigger and has moved upward and is now located under her right breast.  She called her MD yesterday about the pain and discomfort and was told "not to worry about it."  Pt reports not feeling up to physical therapy today.  Pt encouraged to go to emergency room.

## 2023-06-26 ENCOUNTER — Encounter: Payer: Medicare Other | Admitting: *Deleted

## 2024-11-11 ENCOUNTER — Emergency Department (HOSPITAL_COMMUNITY)
Admission: EM | Admit: 2024-11-11 | Discharge: 2024-11-11 | Discharge status: Against Medical Advice | Attending: Emergency Medicine | Admitting: Emergency Medicine

## 2024-11-11 ENCOUNTER — Other Ambulatory Visit: Payer: Self-pay

## 2024-11-11 ENCOUNTER — Encounter (HOSPITAL_COMMUNITY): Payer: Self-pay

## 2024-11-11 ENCOUNTER — Emergency Department (HOSPITAL_COMMUNITY)

## 2024-11-11 DIAGNOSIS — R55 Syncope and collapse: Secondary | ICD-10-CM | POA: Diagnosis present

## 2024-11-11 DIAGNOSIS — Z5321 Procedure and treatment not carried out due to patient leaving prior to being seen by health care provider: Secondary | ICD-10-CM | POA: Insufficient documentation

## 2024-11-11 DIAGNOSIS — Z79899 Other long term (current) drug therapy: Secondary | ICD-10-CM | POA: Diagnosis not present

## 2024-11-11 LAB — CBC
HCT: 40.6 % (ref 36.0–46.0)
Hemoglobin: 13.6 g/dL (ref 12.0–15.0)
MCH: 30.8 pg (ref 26.0–34.0)
MCHC: 33.5 g/dL (ref 30.0–36.0)
MCV: 92.1 fL (ref 80.0–100.0)
Platelets: 234 K/uL (ref 150–400)
RBC: 4.41 MIL/uL (ref 3.87–5.11)
RDW: 13.8 % (ref 11.5–15.5)
WBC: 4.2 K/uL (ref 4.0–10.5)
nRBC: 0 % (ref 0.0–0.2)

## 2024-11-11 LAB — URINE DRUG SCREEN
Amphetamines: NEGATIVE
Barbiturates: NEGATIVE
Benzodiazepines: POSITIVE — AB
Cocaine: NEGATIVE
Fentanyl: NEGATIVE
Methadone Scn, Ur: NEGATIVE
Opiates: NEGATIVE
Tetrahydrocannabinol: NEGATIVE

## 2024-11-11 LAB — URINALYSIS, ROUTINE W REFLEX MICROSCOPIC
Bacteria, UA: NONE SEEN
Bilirubin Urine: NEGATIVE
Glucose, UA: NEGATIVE mg/dL
Ketones, ur: NEGATIVE mg/dL
Leukocytes,Ua: NEGATIVE
Nitrite: NEGATIVE
Protein, ur: NEGATIVE mg/dL
Specific Gravity, Urine: 1.012 (ref 1.005–1.030)
pH: 5 (ref 5.0–8.0)

## 2024-11-11 LAB — COMPREHENSIVE METABOLIC PANEL WITH GFR
ALT: 20 U/L (ref 0–44)
AST: 16 U/L (ref 15–41)
Albumin: 4.3 g/dL (ref 3.5–5.0)
Alkaline Phosphatase: 131 U/L — ABNORMAL HIGH (ref 38–126)
Anion gap: 12 (ref 5–15)
BUN: 11 mg/dL (ref 6–20)
CO2: 24 mmol/L (ref 22–32)
Calcium: 9 mg/dL (ref 8.9–10.3)
Chloride: 100 mmol/L (ref 98–111)
Creatinine, Ser: 0.74 mg/dL (ref 0.44–1.00)
GFR, Estimated: >60 mL/min (ref >60)
Glucose, Bld: 94 mg/dL (ref 70–99)
Potassium: 4.1 mmol/L (ref 3.5–5.1)
Sodium: 136 mmol/L (ref 135–145)
Total Bilirubin: 0.3 mg/dL (ref 0.0–1.2)
Total Protein: 6.9 g/dL (ref 6.5–8.1)

## 2024-11-11 LAB — CBG MONITORING, ED: Glucose-Capillary: 130 mg/dL — ABNORMAL HIGH (ref 70–99)

## 2024-11-11 NOTE — ED Provider Notes (Signed)
 [place] EMERGENCY DEPARTMENT AT [facility] Provider Note   CSN: [id number] Arrival date & time: 11/11/24  1156     Patient presents with: No chief complaint on file.   Kelli Miranda is a 48 y.o. female presents to the emergency department after witnessed syncopal episode earlier today while at court, observed by family members.  The patient has a complex medical history, including prior stroke, seizure disorder, obstructive hyper hydrocephalus with VP shunt, bipolar disorder, and multiple other psychiatric disorders.  She reports that she may have a shunt infection although she is unable to provide specific symptoms concerning for shunt malfunction or infection.  She also states that she is currently taking antibiotics prescribed by her PCP for recent URI.  Patient reports feeling generally unwell but denies chest pain, shortness of breath, focal neurological deficits, fever, vomiting, or recent trauma.  She denies urinary symptoms.  She does express concern for possible heavy metal toxicity but she is unable to articulate a reason for this concern or any related exposure.  Patient is currently in no acute distress and is alert and oriented to baseline per daughter present for visit.   HPI     Prior to Admission medications   Medication Sig Start Date End Date Taking? Authorizing Provider  ALPRAZolam  (XANAX ) 0.5 MG tablet Take 0.5 mg by mouth 3 (three) times daily as needed for anxiety.    Yes [provider]  azithromycin  (ZITHROMAX ) 250 MG tablet Take 250 mg by mouth See admin instructions. Take 2 tablets on day 1 then take 1 tablet daily until gone 11/09/24  Yes [provider]  diphenhydramine -acetaminophen  (TYLENOL  PM) 25-500 MG TABS tablet Take 1-2 tablets by mouth at bedtime as needed (sleep).   Yes [provider]  pregabalin  (LYRICA ) 50 MG capsule Take 50 mg by mouth 3 (three) times daily.   Yes [provider]  QUEtiapine   (SEROQUEL ) 300 MG tablet Take 300 mg by mouth See admin instructions. Take 2 tablets at bedtime 07/06/24  Yes [provider]  traZODone  (DESYREL ) 100 MG tablet Take 300 mg by mouth at bedtime as needed for sleep.   Yes [provider]  VENTOLIN  HFA 108 (90 Base) MCG/ACT inhaler INHALE 2 PUFFS BY MOUTH EVERY 4 TO 6 HOURS AS NEEDED FOR WHEEZING. Patient not taking: Reported on 11/11/2024 10/11/16   Zollie Lowers, MD    Allergies: Oxycodone-acetaminophen , Sumatriptan, Iodinated contrast media, Latex, Nicotine, Oxycodone, and Other    Review of Systems  Neurological:  Positive for syncope.    Updated Vital Signs BP 131/84   Pulse 98   Temp 97.7 F (36.5 C) (Oral)   Resp (!) 21   Ht 5' 2 (1.575 m)   Wt 54.4 kg   SpO2 97%   BMI 21.94 kg/m   Physical Exam Vitals and nursing note reviewed.  Constitutional:      General: She is not in acute distress.    Appearance: Normal appearance.  HENT:     Head: Normocephalic and atraumatic.  Eyes:     Extraocular Movements: Extraocular movements intact.     Conjunctiva/sclera: Conjunctivae normal.     Pupils: Pupils are equal, round, and reactive to light.  Cardiovascular:     Rate and Rhythm: Normal rate and regular rhythm.     Pulses: Normal pulses.  Pulmonary:     Effort: Pulmonary effort is normal. No respiratory distress.  Abdominal:     General: Abdomen is flat.     Palpations:  Abdomen is soft.     Tenderness: There is no abdominal tenderness.  Musculoskeletal:        General: Normal range of motion.     Cervical back: Normal range of motion.  Skin:    General: Skin is warm and dry.     Capillary Refill: Capillary refill takes less than 2 seconds.  Neurological:     General: No focal deficit present.     Mental Status: She is alert. Mental status is at baseline.  Psychiatric:        Mood and Affect: Mood normal.     (all labs ordered are listed, but only abnormal results are displayed) Labs Reviewed   COMPREHENSIVE METABOLIC PANEL WITH GFR - Abnormal; Notable for the following components:      Result Value   Alkaline Phosphatase 131 (*)    All other components within normal limits  URINALYSIS, ROUTINE W REFLEX MICROSCOPIC - Abnormal; Notable for the following components:   APPearance HAZY (*)    Hgb urine dipstick MODERATE (*)    All other components within normal limits  URINE DRUG SCREEN - Abnormal; Notable for the following components:   Benzodiazepines POSITIVE (*)    All other components within normal limits  CBG MONITORING, ED - Abnormal; Notable for the following components:   Glucose-Capillary 130 (*)    All other components within normal limits  CBC    EKG: EKG Interpretation Date/Time:  Thursday November 11 2024 13:44:09 EST Ventricular Rate:  93 PR Interval:  168 QRS Duration:  89 QT Interval:  375 QTC Calculation: 467 R Axis:   16  Text Interpretation: Sinus rhythm RSR' in V1 or V2, right VCD or RVH Borderline ST elevation, lateral leads Confirmed by Francesca Fallow ([id number]) on 11/11/2024 4:09:11 PM  Radiology: No results found.   Procedures   Medications Ordered in the ED - No data to display  Clinical Course as of 11/22/24 0742  Thu Nov 11, 2024  1406 EKG 12-Lead [ML]    Clinical Course User Index [ML] Willma Duwaine CROME, [place]                                 Medical Decision Making Amount and/or Complexity of Data Reviewed Labs: ordered. Radiology: ordered. ECG/medicine tests:  Decision-making details documented in ED Course.   Patient presents to the ED after a syncopal episode, this involves an extensive number of treatment options, and is a complaint that carries with it a high risk of complications and morbidity.    The differential diagnosis includes: Cardiac etiology  Metabolic etiology CVA/TIA  Co morbidities that complicate the patient evaluation: Stroke Seizures Obstructive hydrocephalus Bipolar disorder  Additional history  obtained: Additional history obtained from  [facility] and Outside Medical Records   Lab Tests: I ordered, and personally interpreted labs.   The pertinent results include:  UDS positive for benzodiazepines UA positive for moderate Hgb  Cardiac Monitoring: The patient was maintained on a cardiac monitor.  I personally viewed and interpreted the cardiac monitored which showed an underlying rhythm of: Normal sinus rhythm  Test Considered: Diagnostic testing was considered based on the patient's presenting symptoms, risk factors, and initial clinical assessment.  CT head was discussed with patient using shared decision making. The approach to diagnostic testing prioritized exclusion of life-threatening conditions  Problem List / ED Course: Problem List: Evaluation after syncopal episode Emergency Department Course: The patient presented for an  evaluation after syncopal episode. Initial assessment included history, physical exam, and review of prior medical records. Physical exam was unremarkable, and patient was neurologically intact.  Laboratory evaluation was obtained, urine drug screen was positive for benzodiazepines, which may have contributed to her episode, however patient also is prescribed benzodiazepines for daily use.  No other toxidromes were identified.  The patient also request evaluation for heavy metal toxicity but she was unable to provide a medical indication, exposure history, or symptoms consistent with toxicity.  Given her shunt history, imaging studies were discussed using shared decision making, including CT head. While awaiting imaging, RN notified that the patient had become restless and agitated, had removed her cardiac leads, and her daughter was attempting to remove the patient's IV.  On reevaluation, the patient stated that she wished to leave AMA reporting that she just wants to go home and sleep and that she would not be able to rest in the emergency department.   She remained clinically stable, oriented, and demonstrated decisional capacity.   Disposition: Patient left AMA after documentation of capacity, and she was instructed to return immediately for worsening symptoms, recurrent syncope, severe headache, fever, or neurological changes. I have voiced my concerns for the patient's health given that a full evaluation and treatment had not occurred. I have discussed the need for continued evaluation to determine if their symptoms are caused by a condition that present risk of death or morbidity. Risks including but not limited to death, permanent disability, prolonged hospitalization, prolonged illness, were discussed. I discussed benefits of additional treatment, as well as tried offering alternative options in hopes that the patient might be amenable to partial evaluation and treatment which would be medically beneficial to the patient. However, the patient declined my options and insisted on leaving. Because I have been unable to convince the patient to stay, I answered all of their questions about their condition and asked them to return to the ED as soon as possible to complete their evaluation, especially if their symptoms worsen or do not improve. I emphasized that leaving against medical advice does not preclude returning here for further evaluation. I asked the patient to return if they change their mind about the further evaluation and treatment. I strongly encouraged the patient to return to this Emergency Department or any Emergency Department at any time, particularly with worsening symptoms.     Final diagnoses:  Syncope and collapse    ED Discharge Orders     None          Willma Duwaine CROME, [place] 11/22/24 0751    Francesca Elsie CROME, MD 11/24/24 [phone]

## 2024-11-11 NOTE — ED Notes (Signed)
 Pt removed all equipment before RN or NT could get a final set of vitals. PT expressed understanding that the hospital would like her to complete her evaluation. Benefits and risks were explained. Pt asked to leave AMA after education.

## 2024-11-11 NOTE — ED Notes (Signed)
 Pt very lethargic while asking triage questions. When primary RN asked pt if she may have taken too much of her alprazolam  I did not take too much I am allowed 3 a day and I know what I take.

## 2024-11-11 NOTE — ED Triage Notes (Signed)
 Pt BIB ems for syncopal episode while at court. EMS states pt was alert when they arrived. Pt states she is currently being treated on antibiotics for a VP shunt.
